# Patient Record
Sex: Male | Born: 1950 | Race: Black or African American | Hispanic: No | Marital: Married | Smoking: Never smoker
Health system: Southern US, Community
[De-identification: ages and names within clinical notes are randomized; demographics above are authoritative.]

## PROBLEM LIST (undated history)

## (undated) DIAGNOSIS — E785 Hyperlipidemia, unspecified: Secondary | ICD-10-CM

## (undated) DIAGNOSIS — I10 Essential (primary) hypertension: Secondary | ICD-10-CM

## (undated) HISTORY — DX: Essential (primary) hypertension: I10

## (undated) HISTORY — DX: Hyperlipidemia, unspecified: E78.5

---

## 2004-07-02 ENCOUNTER — Ambulatory Visit: Payer: Self-pay

## 2005-07-21 ENCOUNTER — Emergency Department: Payer: Self-pay | Admitting: Internal Medicine

## 2007-08-19 ENCOUNTER — Ambulatory Visit: Payer: Self-pay | Admitting: Gastroenterology

## 2009-01-27 HISTORY — PX: COLONOSCOPY: SHX174

## 2014-11-09 ENCOUNTER — Other Ambulatory Visit: Payer: Self-pay | Admitting: Family Medicine

## 2014-11-29 ENCOUNTER — Ambulatory Visit (INDEPENDENT_AMBULATORY_CARE_PROVIDER_SITE_OTHER): Payer: BC Managed Care – PPO | Admitting: Family Medicine

## 2014-11-29 ENCOUNTER — Encounter: Payer: Self-pay | Admitting: Family Medicine

## 2014-11-29 VITALS — BP 134/82 | HR 72 | Ht 68.0 in | Wt 165.0 lb

## 2014-11-29 DIAGNOSIS — Z1211 Encounter for screening for malignant neoplasm of colon: Secondary | ICD-10-CM

## 2014-11-29 DIAGNOSIS — E785 Hyperlipidemia, unspecified: Secondary | ICD-10-CM

## 2014-11-29 DIAGNOSIS — I10 Essential (primary) hypertension: Secondary | ICD-10-CM

## 2014-11-29 LAB — HEMOCCULT GUIAC POC 1CARD (OFFICE): Fecal Occult Blood, POC: NEGATIVE

## 2014-11-29 MED ORDER — AMLODIPINE BESYLATE 2.5 MG PO TABS
2.5000 mg | ORAL_TABLET | Freq: Every day | ORAL | Status: DC
Start: 2014-11-29 — End: 2015-05-29

## 2014-11-29 NOTE — Progress Notes (Signed)
Name: Christopher Browning   MRN: [id number]    DOB: January 15, 1951   Date:11/29/2014       Progress Note  Subjective  Chief Complaint  Chief Complaint  Patient presents with  . Hypertension    Hypertension This is a chronic problem. The current episode started more than 1 year ago. The problem has been gradually improving since onset. The problem is controlled. Pertinent negatives include no anxiety, blurred vision, chest pain, headaches, malaise/fatigue, neck pain, orthopnea, palpitations, peripheral edema, PND, shortness of breath or sweats. There are no associated agents to hypertension. Risk factors for coronary artery disease include male gender. Past treatments include calcium channel blockers. The current treatment provides mild improvement. There are no compliance problems.  There is no history of angina, kidney disease, CAD/MI, CVA, heart failure, left ventricular hypertrophy, PVD, renovascular disease or retinopathy. There is no history of chronic renal disease.    No problem-specific assessment & plan notes found for this encounter.   No past medical history on file.  Past Surgical History  Procedure Laterality Date  . Colonoscopy  2011    cleared for 5 yrs-    No family history on file.  Social History   Social History  . Marital Status: Married    Spouse Name: N/A  . Number of Children: N/A  . Years of Education: N/A   Occupational History  . Not on file.   Social History Main Topics  . Smoking status: Never Smoker   . Smokeless tobacco: Not on file  . Alcohol Use: No  . Drug Use: No  . Sexual Activity: Not on file   Other Topics Concern  . Not on file   Social History Narrative  . No narrative on file    No Known Allergies   Review of Systems  Constitutional: Negative for fever, chills, weight loss and malaise/fatigue.  HENT: Negative for ear discharge, ear pain and sore throat.   Eyes: Negative for blurred vision.  Respiratory: Negative for cough,  sputum production, shortness of breath and wheezing.   Cardiovascular: Negative for chest pain, palpitations, orthopnea, leg swelling and PND.  Gastrointestinal: Negative for heartburn, nausea, abdominal pain, diarrhea, constipation, blood in stool and melena.  Genitourinary: Negative for dysuria, urgency, frequency and hematuria.  Musculoskeletal: Negative for myalgias, back pain, joint pain and neck pain.  Skin: Negative for rash.  Neurological: Negative for dizziness, tingling, sensory change, focal weakness and headaches.  Endo/Heme/Allergies: Negative for environmental allergies and polydipsia. Does not bruise/bleed easily.  Psychiatric/Behavioral: Negative for depression and suicidal ideas. The patient is not nervous/anxious and does not have insomnia.      Objective  Filed Vitals:   11/29/14 0818  BP: 134/82  Pulse: 72  Height: 5\' 8"  (1.727 m)  Weight: 165 lb (74.844 kg)    Physical Exam  Constitutional: He is oriented to person, place, and time and well-developed, well-nourished, and in no distress.  HENT:  Head: Normocephalic.  Right Ear: External ear normal.  Left Ear: External ear normal.  Nose: Nose normal.  Mouth/Throat: Oropharynx is clear and moist.  Eyes: Conjunctivae and EOM are normal. Pupils are equal, round, and reactive to light. Right eye exhibits no discharge. Left eye exhibits no discharge. No scleral icterus.  Neck: Normal range of motion. Neck supple. No JVD present. No tracheal deviation present. No thyromegaly present.  Cardiovascular: Normal rate, regular rhythm, normal heart sounds and intact distal pulses.  Exam reveals no gallop and no friction rub.   No  murmur heard. Pulmonary/Chest: Breath sounds normal. No respiratory distress. He has no wheezes. He has no rales.  Abdominal: Soft. Bowel sounds are normal. He exhibits no mass. There is no hepatosplenomegaly. There is no tenderness. There is no rebound, no guarding and no CVA tenderness.   Genitourinary: Prostate normal. Guaiac positive stool.  Musculoskeletal: Normal range of motion. He exhibits no edema or tenderness.  Lymphadenopathy:    He has no cervical adenopathy.  Neurological: He is alert and oriented to person, place, and time. He has normal sensation, normal strength, normal reflexes and intact cranial nerves. No cranial nerve deficit.  Skin: Skin is warm. No rash noted.  Psychiatric: Mood and affect normal.  Nursing note and vitals reviewed.     Assessment & Plan  Problem List Items Addressed This Visit      Cardiovascular and Mediastinum   Essential hypertension - Primary   Relevant Medications   amLODipine (NORVASC) 2.5 MG tablet   Other Relevant Orders   Renal Function Panel    Other Visit Diagnoses    Colon cancer screening        Relevant Orders    POCT Occult Blood Stool (Completed)    Hyperlipidemia        Relevant Medications    amLODipine (NORVASC) 2.5 MG tablet    Other Relevant Orders    Lipid Profile         Dr. Elizabeth Sauer [facility]  11/29/2014

## 2014-11-30 LAB — RENAL FUNCTION PANEL
Albumin: 4.4 g/dL (ref 3.6–4.8)
BUN/Creatinine Ratio: 16 (ref 10–22)
BUN: 16 mg/dL (ref 8–27)
CHLORIDE: 101 mmol/L (ref 97–106)
CO2: 24 mmol/L (ref 18–29)
Calcium: 9.6 mg/dL (ref 8.6–10.2)
Creatinine, Ser: 0.99 mg/dL (ref 0.76–1.27)
GFR, EST AFRICAN AMERICAN: 94 mL/min/{1.73_m2} (ref >59)
GFR, EST NON AFRICAN AMERICAN: 81 mL/min/{1.73_m2} (ref >59)
GLUCOSE: 91 mg/dL (ref 65–99)
POTASSIUM: 4.1 mmol/L (ref 3.5–5.2)
Phosphorus: 3 mg/dL (ref 2.5–4.5)
SODIUM: 140 mmol/L (ref 136–144)

## 2014-11-30 LAB — LIPID PANEL
CHOL/HDL RATIO: 4 ratio (ref 0.0–5.0)
Cholesterol, Total: 199 mg/dL (ref 100–199)
HDL: 50 mg/dL (ref >39)
LDL Calculated: 134 mg/dL — ABNORMAL HIGH (ref 0–99)
Triglycerides: 73 mg/dL (ref 0–149)
VLDL Cholesterol Cal: 15 mg/dL (ref 5–40)

## 2014-12-07 ENCOUNTER — Other Ambulatory Visit (INDEPENDENT_AMBULATORY_CARE_PROVIDER_SITE_OTHER): Payer: BC Managed Care – PPO

## 2014-12-07 DIAGNOSIS — I10 Essential (primary) hypertension: Secondary | ICD-10-CM | POA: Diagnosis not present

## 2014-12-07 LAB — HEMOCCULT GUIAC POC 1CARD (OFFICE)
FECAL OCCULT BLD: NEGATIVE
FECAL OCCULT BLD: NEGATIVE
FECAL OCCULT BLD: NEGATIVE

## 2015-05-29 ENCOUNTER — Encounter: Payer: Self-pay | Admitting: Family Medicine

## 2015-05-29 ENCOUNTER — Ambulatory Visit (INDEPENDENT_AMBULATORY_CARE_PROVIDER_SITE_OTHER): Payer: BC Managed Care – PPO | Admitting: Family Medicine

## 2015-05-29 VITALS — BP 120/80 | HR 76 | Ht 68.0 in | Wt 165.0 lb

## 2015-05-29 DIAGNOSIS — I10 Essential (primary) hypertension: Secondary | ICD-10-CM | POA: Diagnosis not present

## 2015-05-29 MED ORDER — AMLODIPINE BESYLATE 2.5 MG PO TABS: 2.5000 mg | ORAL_TABLET | Freq: Every day | ORAL | Status: DC

## 2015-05-29 NOTE — Progress Notes (Signed)
Name: Christopher Browning   MRN: [id number]    DOB: 11-Sep-1950   Date:05/29/2015       Progress Note  Subjective  Chief Complaint  Chief Complaint  Patient presents with  . Hypertension    Hypertension This is a chronic problem. The current episode started more than 1 year ago. The problem has been gradually improving since onset. The problem is controlled. Pertinent negatives include no anxiety, blurred vision, chest pain, headaches, malaise/fatigue, neck pain, orthopnea, palpitations, peripheral edema, PND, shortness of breath or sweats. There are no associated agents to hypertension. There are no known risk factors for coronary artery disease. Past treatments include calcium channel blockers. The current treatment provides moderate improvement. There are no compliance problems.  There is no history of angina, kidney disease, CAD/MI, CVA, heart failure, left ventricular hypertrophy, PVD, renovascular disease or retinopathy. There is no history of chronic renal disease or a hypertension causing med.    No problem-specific assessment & plan notes found for this encounter.   Past Medical History  Diagnosis Date  . Hypertension     Past Surgical History  Procedure Laterality Date  . Colonoscopy  2011    cleared for 5 yrs-    History reviewed. No pertinent family history.  Social History   Social History  . Marital Status: Married    Spouse Name: N/A  . Number of Children: N/A  . Years of Education: N/A   Occupational History  . Not on file.   Social History Main Topics  . Smoking status: Never Smoker   . Smokeless tobacco: Not on file  . Alcohol Use: No  . Drug Use: No  . Sexual Activity: Not on file   Other Topics Concern  . Not on file   Social History Narrative    No Known Allergies   Review of Systems  Constitutional: Negative for fever, chills, weight loss and malaise/fatigue.  HENT: Negative for ear discharge, ear pain and sore throat.   Eyes: Negative for  blurred vision.  Respiratory: Negative for cough, sputum production, shortness of breath and wheezing.   Cardiovascular: Negative for chest pain, palpitations, orthopnea, leg swelling and PND.  Gastrointestinal: Negative for heartburn, nausea, abdominal pain, diarrhea, constipation, blood in stool and melena.  Genitourinary: Negative for dysuria, urgency, frequency and hematuria.  Musculoskeletal: Negative for myalgias, back pain, joint pain and neck pain.  Skin: Negative for rash.  Neurological: Negative for dizziness, tingling, sensory change, focal weakness and headaches.  Endo/Heme/Allergies: Negative for environmental allergies and polydipsia. Does not bruise/bleed easily.  Psychiatric/Behavioral: Negative for depression and suicidal ideas. The patient is not nervous/anxious and does not have insomnia.      Objective  Filed Vitals:   05/29/15 0819  BP: 120/80  Pulse: 76  Height: 5\' 8"  (1.727 m)  Weight: 165 lb (74.844 kg)    Physical Exam  Constitutional: He is oriented to person, place, and time and well-developed, well-nourished, and in no distress.  HENT:  Head: Normocephalic.  Right Ear: Tympanic membrane, external ear and ear canal normal.  Left Ear: Tympanic membrane, external ear and ear canal normal.  Nose: Nose normal.  Mouth/Throat: Oropharynx is clear and moist.  Eyes: Conjunctivae and EOM are normal. Pupils are equal, round, and reactive to light. Right eye exhibits no discharge. Left eye exhibits no discharge. No scleral icterus.  Neck: Normal range of motion. Neck supple. No JVD present. No tracheal deviation present. No thyromegaly present.  Cardiovascular: Normal rate, regular rhythm, normal heart  sounds and intact distal pulses.  Exam reveals no gallop and no friction rub.   No murmur heard. Pulmonary/Chest: Breath sounds normal. No respiratory distress. He has no wheezes. He has no rales.  Abdominal: Soft. Bowel sounds are normal. He exhibits no mass. There  is no hepatosplenomegaly. There is no tenderness. There is no rebound, no guarding and no CVA tenderness.  Musculoskeletal: Normal range of motion. He exhibits no edema or tenderness.  Lymphadenopathy:    He has no cervical adenopathy.  Neurological: He is alert and oriented to person, place, and time. He has normal sensation, normal strength and intact cranial nerves. No cranial nerve deficit.  Skin: Skin is warm. No rash noted.  Psychiatric: Mood and affect normal.  Nursing note and vitals reviewed.     Assessment & Plan  Problem List Items Addressed This Visit      Cardiovascular and Mediastinum   Essential hypertension - Primary   Relevant Medications   amLODipine (NORVASC) 2.5 MG tablet   Other Relevant Orders   Renal Function Panel        Dr. Elizabeth Sauer [facility]  05/29/2015

## 2015-05-30 LAB — RENAL FUNCTION PANEL
ALBUMIN: 4.6 g/dL (ref 3.6–4.8)
BUN/Creatinine Ratio: 18 (ref 10–24)
BUN: 17 mg/dL (ref 8–27)
CHLORIDE: 100 mmol/L (ref 96–106)
CO2: 22 mmol/L (ref 18–29)
CREATININE: 0.93 mg/dL (ref 0.76–1.27)
Calcium: 9.6 mg/dL (ref 8.6–10.2)
GFR calc non Af Amer: 87 mL/min/{1.73_m2} (ref >59)
GFR, EST AFRICAN AMERICAN: 101 mL/min/{1.73_m2} (ref >59)
Glucose: 76 mg/dL (ref 65–99)
Phosphorus: 2.7 mg/dL (ref 2.5–4.5)
Potassium: 4 mmol/L (ref 3.5–5.2)
Sodium: 140 mmol/L (ref 134–144)

## 2015-11-29 ENCOUNTER — Encounter: Payer: Self-pay | Admitting: Family Medicine

## 2015-11-29 ENCOUNTER — Ambulatory Visit (INDEPENDENT_AMBULATORY_CARE_PROVIDER_SITE_OTHER): Payer: BC Managed Care – PPO | Admitting: Family Medicine

## 2015-11-29 VITALS — BP 160/80 | HR 85 | Ht 68.0 in | Wt 164.0 lb

## 2015-11-29 DIAGNOSIS — I10 Essential (primary) hypertension: Secondary | ICD-10-CM

## 2015-11-29 DIAGNOSIS — R351 Nocturia: Secondary | ICD-10-CM

## 2015-11-29 DIAGNOSIS — Z1211 Encounter for screening for malignant neoplasm of colon: Secondary | ICD-10-CM | POA: Diagnosis not present

## 2015-11-29 DIAGNOSIS — Z23 Encounter for immunization: Secondary | ICD-10-CM | POA: Diagnosis not present

## 2015-11-29 LAB — HEMOCCULT GUIAC POC 1CARD (OFFICE): FECAL OCCULT BLD: NEGATIVE

## 2015-11-29 MED ORDER — AMLODIPINE BESYLATE 5 MG PO TABS
5.0000 mg | ORAL_TABLET | Freq: Every day | ORAL | 5 refills | Status: DC
Start: 2015-11-29 — End: 2016-02-01

## 2015-11-29 NOTE — Progress Notes (Signed)
Name: Christopher Browning   MRN: [id number]    DOB: December 30, 1950   Date:11/29/2015       Progress Note  Subjective  Chief Complaint  Chief Complaint  Patient presents with  . Hypertension    refill med    Hypertension  This is a chronic problem. The current episode started more than 1 year ago. The problem has been gradually worsening since onset. The problem is uncontrolled. Pertinent negatives include no anxiety, blurred vision, chest pain, headaches, malaise/fatigue, neck pain, orthopnea, palpitations, peripheral edema, PND, shortness of breath or sweats. Agents associated with hypertension include decongestants (taken last night). There are no known risk factors for coronary artery disease. There are no compliance problems.  There is no history of angina, kidney disease, CAD/MI, CVA, heart failure, left ventricular hypertrophy, PVD, renovascular disease or retinopathy. There is no history of chronic renal disease or a hypertension causing med.    No problem-specific Assessment & Plan notes found for this encounter.   Past Medical History:  Diagnosis Date  . Hypertension     Past Surgical History:  Procedure Laterality Date  . COLONOSCOPY  2011   cleared for 5 yrs-    History reviewed. No pertinent family history.  Social History   Social History  . Marital status: Married    Spouse name: N/A  . Number of children: N/A  . Years of education: N/A   Occupational History  . Not on file.   Social History Main Topics  . Smoking status: Never Smoker  . Smokeless tobacco: Not on file  . Alcohol use No  . Drug use: No  . Sexual activity: Not on file   Other Topics Concern  . Not on file   Social History Narrative  . No narrative on file    No Known Allergies   Review of Systems  Constitutional: Negative for chills, fever, malaise/fatigue and weight loss.  HENT: Negative for ear discharge, ear pain and sore throat.   Eyes: Negative for blurred vision.  Respiratory:  Negative for cough, sputum production, shortness of breath and wheezing.   Cardiovascular: Negative for chest pain, palpitations, orthopnea, leg swelling and PND.  Gastrointestinal: Negative for abdominal pain, blood in stool, constipation, diarrhea, heartburn, melena and nausea.  Genitourinary: Negative for dysuria, frequency, hematuria and urgency.  Musculoskeletal: Negative for back pain, joint pain, myalgias and neck pain.  Skin: Negative for rash.  Neurological: Negative for dizziness, tingling, sensory change, focal weakness and headaches.  Endo/Heme/Allergies: Negative for environmental allergies and polydipsia. Does not bruise/bleed easily.  Psychiatric/Behavioral: Negative for depression and suicidal ideas. The patient is not nervous/anxious and does not have insomnia.      Objective  Vitals:   11/29/15 0808  BP: (!) 160/80  Pulse: 85  Weight: 164 lb (74.4 kg)  Height: 5\' 8"  (1.727 m)    Physical Exam  Constitutional: Christopher Browning is oriented to person, place, and time and well-developed, well-nourished, and in no distress.  HENT:  Head: Normocephalic.  Right Ear: Tympanic membrane, external ear and ear canal normal.  Left Ear: Tympanic membrane, external ear and ear canal normal.  Nose: Nose normal.  Mouth/Throat: Uvula is midline, oropharynx is clear and moist and mucous membranes are normal. No oropharyngeal exudate, posterior oropharyngeal edema or posterior oropharyngeal erythema.  Eyes: Conjunctivae and EOM are normal. Pupils are equal, round, and reactive to light. Right eye exhibits no discharge. Left eye exhibits no discharge. No scleral icterus.  Neck: Normal range of motion. Neck supple.  No JVD present. No tracheal deviation present. No thyromegaly present.  Cardiovascular: Normal rate, regular rhythm, normal heart sounds and intact distal pulses.  Exam reveals no gallop and no friction rub.   No murmur heard. Pulmonary/Chest: Breath sounds normal. No respiratory distress.  Christopher Browning has no wheezes. Christopher Browning has no rales.  Abdominal: Soft. Bowel sounds are normal. Christopher Browning exhibits no mass. There is no hepatosplenomegaly. There is no tenderness. There is no rebound, no guarding and no CVA tenderness.  Genitourinary: Rectum normal and prostate normal. Rectal exam shows guaiac negative stool.  Musculoskeletal: Normal range of motion. Christopher Browning exhibits no edema or tenderness.  Lymphadenopathy:    Christopher Browning has no cervical adenopathy.  Neurological: Christopher Browning is alert and oriented to person, place, and time. Christopher Browning has normal sensation, normal strength, normal reflexes and intact cranial nerves. No cranial nerve deficit.  Skin: Skin is warm. No rash noted.  Psychiatric: Mood and affect normal.      Assessment & Plan  Problem List Items Addressed This Visit      Cardiovascular and Mediastinum   Essential hypertension - Primary   Relevant Medications   amLODipine (NORVASC) 5 MG tablet   Other Relevant Orders   Renal Function Panel    Other Visit Diagnoses    Nocturia       Relevant Orders   PSA   Immunization due       Relevant Orders   Tdap vaccine greater than or equal to 7yo IM (Completed)   Colon cancer screening       Relevant Orders   POCT Occult Blood Stool (Completed)   Need for Tdap vaccination        I spent 25 minutes with this patient, More than 50% of that time was spent in face to face education, counseling and care coordination.25    Dr. Hayden Rasmusseneanna Phineas Mcenroe [facility]  11/29/15

## 2015-11-30 LAB — RENAL FUNCTION PANEL
ALBUMIN: 4.5 g/dL (ref 3.6–4.8)
BUN/Creatinine Ratio: 14 (ref 10–24)
BUN: 16 mg/dL (ref 8–27)
CO2: 24 mmol/L (ref 18–29)
CREATININE: 1.12 mg/dL (ref 0.76–1.27)
Calcium: 9.8 mg/dL (ref 8.6–10.2)
Chloride: 100 mmol/L (ref 96–106)
GFR calc Af Amer: 80 mL/min/{1.73_m2} (ref >59)
GFR, EST NON AFRICAN AMERICAN: 70 mL/min/{1.73_m2} (ref >59)
Glucose: 89 mg/dL (ref 65–99)
PHOSPHORUS: 3 mg/dL (ref 2.5–4.5)
POTASSIUM: 4.2 mmol/L (ref 3.5–5.2)
Sodium: 142 mmol/L (ref 134–144)

## 2015-11-30 LAB — PSA: PROSTATE SPECIFIC AG, SERUM: 0.9 ng/mL (ref 0.0–4.0)

## 2016-02-01 ENCOUNTER — Ambulatory Visit (INDEPENDENT_AMBULATORY_CARE_PROVIDER_SITE_OTHER): Payer: BC Managed Care – PPO | Admitting: Family Medicine

## 2016-02-01 ENCOUNTER — Encounter: Payer: Self-pay | Admitting: Family Medicine

## 2016-02-01 VITALS — BP 142/78 | HR 80 | Ht 68.0 in | Wt 164.0 lb

## 2016-02-01 DIAGNOSIS — I10 Essential (primary) hypertension: Secondary | ICD-10-CM

## 2016-02-01 MED ORDER — AMLODIPINE BESYLATE 5 MG PO TABS: 5.0000 mg | ORAL_TABLET | Freq: Every day | ORAL | 1 refills | Status: DC

## 2016-02-01 NOTE — Progress Notes (Signed)
Name: Christopher Browning   MRN: [id number]    DOB: [birth year]   Date:02/01/2016       Progress Note  Subjective  Chief Complaint  Chief Complaint  Patient presents with  . Hypertension    changed B/P med last visit    Hypertension  This is a chronic problem. The current episode started more than 1 year ago. The problem has been gradually improving since onset. The problem is controlled. Pertinent negatives include no anxiety, blurred vision, chest pain, headaches, malaise/fatigue, neck pain, orthopnea, palpitations, peripheral edema, PND, shortness of breath or sweats. There are no associated agents to hypertension. There are no known risk factors for coronary artery disease. Past treatments include calcium channel blockers. The current treatment provides moderate improvement. There are no compliance problems.  There is no history of angina, kidney disease, CAD/MI, CVA, heart failure, left ventricular hypertrophy, PVD, renovascular disease or retinopathy. There is no history of chronic renal disease or a hypertension causing med.    No problem-specific Assessment & Plan notes found for this encounter.   Past Medical History:  Diagnosis Date  . Hypertension     Past Surgical History:  Procedure Laterality Date  . COLONOSCOPY  2011   cleared for 5 yrs-    History reviewed. No pertinent family history.  Social History   Social History  . Marital status: Married    Spouse name: N/A  . Number of children: N/A  . Years of education: N/A   Occupational History  . Not on file.   Social History Main Topics  . Smoking status: Never Smoker  . Smokeless tobacco: Not on file  . Alcohol use No  . Drug use: No  . Sexual activity: Not on file   Other Topics Concern  . Not on file   Social History Narrative  . No narrative on file    No Known Allergies   Review of Systems  Constitutional: Negative for chills, fever, malaise/fatigue and weight loss.  HENT: Negative for ear  discharge, ear pain and sore throat.   Eyes: Negative for blurred vision.  Respiratory: Negative for cough, sputum production, shortness of breath and wheezing.   Cardiovascular: Negative for chest pain, palpitations, orthopnea, leg swelling and PND.  Gastrointestinal: Negative for abdominal pain, blood in stool, constipation, diarrhea, heartburn, melena and nausea.  Genitourinary: Negative for dysuria, frequency, hematuria and urgency.  Musculoskeletal: Negative for back pain, joint pain, myalgias and neck pain.  Skin: Negative for rash.  Neurological: Negative for dizziness, tingling, sensory change, focal weakness and headaches.  Endo/Heme/Allergies: Negative for environmental allergies and polydipsia. Does not bruise/bleed easily.  Psychiatric/Behavioral: Negative for depression and suicidal ideas. The patient is not nervous/anxious and does not have insomnia.      Objective  Vitals:   02/01/16 0825  BP: (!) 142/78  Pulse: 80  Weight: 164 lb (74.4 kg)  Height: 5\' 8"  (1.727 m)    Physical Exam  Constitutional: He is oriented to person, place, and time and well-developed, well-nourished, and in no distress.  HENT:  Head: Normocephalic.  Right Ear: External ear normal.  Left Ear: External ear normal.  Nose: Nose normal.  Mouth/Throat: Oropharynx is clear and moist.  Eyes: Conjunctivae and EOM are normal. Pupils are equal, round, and reactive to light. Right eye exhibits no discharge. Left eye exhibits no discharge. No scleral icterus.  Neck: Normal range of motion. Neck supple. No JVD present. No tracheal deviation present. No thyromegaly present.  Cardiovascular: Normal rate, regular  rhythm, normal heart sounds and intact distal pulses.  Exam reveals no gallop and no friction rub.   No murmur heard. Pulmonary/Chest: Breath sounds normal. No respiratory distress. He has no wheezes. He has no rales.  Abdominal: Soft. Bowel sounds are normal. He exhibits no mass. There is no  hepatosplenomegaly. There is no tenderness. There is no rebound, no guarding and no CVA tenderness.  Musculoskeletal: Normal range of motion. He exhibits no edema or tenderness.  Lymphadenopathy:    He has no cervical adenopathy.  Neurological: He is alert and oriented to person, place, and time. He has normal sensation, normal strength, normal reflexes and intact cranial nerves. No cranial nerve deficit.  Skin: Skin is warm. No rash noted.  Psychiatric: Mood and affect normal.  Nursing note and vitals reviewed.     Assessment & Plan  Problem List Items Addressed This Visit      Cardiovascular and Mediastinum   Essential hypertension - Primary   Relevant Medications   amLODipine (NORVASC) 5 MG tablet    I spent 15 minutes with this patient, More than 50% of that time was spent in face to face education, counseling and care coordination.   Dr. Hayden Rasmusseneanna Vennessa Affinito [facility]  02/01/16

## 2016-06-30 ENCOUNTER — Ambulatory Visit (INDEPENDENT_AMBULATORY_CARE_PROVIDER_SITE_OTHER): Payer: BC Managed Care – PPO | Admitting: Family Medicine

## 2016-06-30 ENCOUNTER — Encounter: Payer: Self-pay | Admitting: Family Medicine

## 2016-06-30 VITALS — BP 122/80 | HR 80 | Ht 68.0 in | Wt 164.0 lb

## 2016-06-30 DIAGNOSIS — J40 Bronchitis, not specified as acute or chronic: Secondary | ICD-10-CM

## 2016-06-30 DIAGNOSIS — J0100 Acute maxillary sinusitis, unspecified: Secondary | ICD-10-CM | POA: Diagnosis not present

## 2016-06-30 MED ORDER — AMOXICILLIN 500 MG PO CAPS: 500.0000 mg | ORAL_CAPSULE | Freq: Three times a day (TID) | ORAL | 0 refills | Status: DC

## 2016-06-30 MED ORDER — GUAIFENESIN-CODEINE 100-10 MG/5ML PO SYRP: 5.0000 mL | ORAL_SOLUTION | Freq: Three times a day (TID) | ORAL | 0 refills | Status: DC | PRN

## 2016-06-30 NOTE — Progress Notes (Signed)
Name: Christopher Browning   MRN: [id number]    DOB: 11-10-1950   Date:06/30/2016       Progress Note  Subjective  Chief Complaint  Chief Complaint  Patient presents with  . Sinusitis    cough and cong- otc not helping    Sinusitis  This is a new problem. The current episode started in the past 7 days. The problem has been gradually worsening since onset. There has been no fever. He is experiencing no pain. Associated symptoms include congestion, coughing, diaphoresis, headaches and sinus pressure. Pertinent negatives include no chills, ear pain, hoarse voice, neck pain, shortness of breath, sneezing, sore throat or swollen glands. Past treatments include oral decongestants. The treatment provided no relief.  Cough  This is a new problem. The current episode started in the past 7 days. The problem has been waxing and waning. The problem occurs hourly. The cough is productive of purulent sputum and productive of sputum. Associated symptoms include headaches, nasal congestion, postnasal drip and rhinorrhea. Pertinent negatives include no chest pain, chills, ear pain, fever, heartburn, hemoptysis, myalgias, rash, sore throat, shortness of breath, weight loss or wheezing. There is no history of environmental allergies.    No problem-specific Assessment & Plan notes found for this encounter.   Past Medical History:  Diagnosis Date  . Hypertension     Past Surgical History:  Procedure Laterality Date  . COLONOSCOPY  2011   cleared for 5 yrs-    No family history on file.  Social History   Social History  . Marital status: Married    Spouse name: N/A  . Number of children: N/A  . Years of education: N/A   Occupational History  . Not on file.   Social History Main Topics  . Smoking status: Never Smoker  . Smokeless tobacco: Never Used  . Alcohol use No  . Drug use: No  . Sexual activity: Not on file   Other Topics Concern  . Not on file   Social History Narrative  . No  narrative on file    No Known Allergies  Outpatient Medications Prior to Visit  Medication Sig Dispense Refill  . amLODipine (NORVASC) 5 MG tablet Take 1 tablet (5 mg total) by mouth daily. 90 tablet 1   No facility-administered medications prior to visit.     Review of Systems  Constitutional: Positive for diaphoresis. Negative for chills, fever, malaise/fatigue and weight loss.  HENT: Positive for congestion, postnasal drip, rhinorrhea and sinus pressure. Negative for ear discharge, ear pain, hoarse voice, sneezing and sore throat.   Eyes: Negative for blurred vision.  Respiratory: Positive for cough. Negative for hemoptysis, sputum production, shortness of breath and wheezing.   Cardiovascular: Negative for chest pain, palpitations and leg swelling.  Gastrointestinal: Negative for abdominal pain, blood in stool, constipation, diarrhea, heartburn, melena and nausea.  Genitourinary: Negative for dysuria, frequency, hematuria and urgency.  Musculoskeletal: Negative for back pain, joint pain, myalgias and neck pain.  Skin: Negative for rash.  Neurological: Positive for headaches. Negative for dizziness, tingling, sensory change and focal weakness.  Endo/Heme/Allergies: Negative for environmental allergies and polydipsia. Does not bruise/bleed easily.  Psychiatric/Behavioral: Negative for depression and suicidal ideas. The patient is not nervous/anxious and does not have insomnia.      Objective  Vitals:   06/30/16 0956  BP: 122/80  Pulse: 80  Weight: 164 lb (74.4 kg)  Height: 5\' 8"  (1.727 m)    Physical Exam  Constitutional: He is oriented to  person, place, and time and well-developed, well-nourished, and in no distress.  HENT:  Head: Normocephalic.  Right Ear: External ear normal.  Left Ear: External ear normal.  Nose: Nose normal.  Mouth/Throat: Oropharynx is clear and moist.  Eyes: Conjunctivae and EOM are normal. Pupils are equal, round, and reactive to light. Right  eye exhibits no discharge. Left eye exhibits no discharge. No scleral icterus.  Neck: Normal range of motion. Neck supple. No JVD present. No tracheal deviation present. No thyromegaly present.  Cardiovascular: Normal rate, regular rhythm, normal heart sounds and intact distal pulses.  Exam reveals no gallop and no friction rub.   No murmur heard. Pulmonary/Chest: Breath sounds normal. No respiratory distress. He has no wheezes. He has no rales.  Abdominal: Soft. Bowel sounds are normal. He exhibits no mass. There is no hepatosplenomegaly. There is no tenderness. There is no rebound, no guarding and no CVA tenderness.  Musculoskeletal: Normal range of motion. He exhibits no edema or tenderness.  Lymphadenopathy:    He has no cervical adenopathy.  Neurological: He is alert and oriented to person, place, and time. He has normal sensation, normal strength, normal reflexes and intact cranial nerves. No cranial nerve deficit.  Skin: Skin is warm. No rash noted.  Psychiatric: Mood and affect normal.  Nursing note and vitals reviewed.     Assessment & Plan  Problem List Items Addressed This Visit    None    Visit Diagnoses    Acute maxillary sinusitis, recurrence not specified    -  Primary   Relevant Medications   amoxicillin (AMOXIL) 500 MG capsule   guaiFENesin-codeine (ROBITUSSIN AC) 100-10 MG/5ML syrup   Bronchitis       Relevant Medications   amoxicillin (AMOXIL) 500 MG capsule   guaiFENesin-codeine (ROBITUSSIN AC) 100-10 MG/5ML syrup      Meds ordered this encounter  Medications  . amoxicillin (AMOXIL) 500 MG capsule    Sig: Take 1 capsule (500 mg total) by mouth 3 (three) times daily.    Dispense:  30 capsule    Refill:  0  . guaiFENesin-codeine (ROBITUSSIN AC) 100-10 MG/5ML syrup    Sig: Take 5 mLs by mouth 3 (three) times daily as needed for cough.    Dispense:  100 mL    Refill:  0      Dr. Hayden Rasmusseneanna [facility]  06/30/16

## 2016-08-01 ENCOUNTER — Ambulatory Visit: Payer: BC Managed Care – PPO | Admitting: Family Medicine

## 2016-08-04 ENCOUNTER — Encounter: Payer: Self-pay | Admitting: Family Medicine

## 2016-08-04 ENCOUNTER — Ambulatory Visit (INDEPENDENT_AMBULATORY_CARE_PROVIDER_SITE_OTHER): Payer: BC Managed Care – PPO | Admitting: Family Medicine

## 2016-08-04 DIAGNOSIS — I10 Essential (primary) hypertension: Secondary | ICD-10-CM | POA: Diagnosis not present

## 2016-08-04 MED ORDER — AMLODIPINE BESYLATE 5 MG PO TABS: 5.0000 mg | ORAL_TABLET | Freq: Every day | ORAL | 1 refills | Status: DC

## 2016-08-04 NOTE — Progress Notes (Signed)
Name: Christopher Browning   MRN: [id number]    DOB: Sep 28, 1950   Date:08/04/2016       Progress Note  Subjective  Chief Complaint  Chief Complaint  Patient presents with  . Hypertension    Hypertension  This is a chronic problem. The current episode started more than 1 year ago. The problem has been gradually improving since onset. The problem is controlled. Pertinent negatives include no anxiety, blurred vision, chest pain, headaches, malaise/fatigue, neck pain, orthopnea, palpitations, peripheral edema, PND, shortness of breath or sweats. There are no associated agents to hypertension. Past treatments include calcium channel blockers.    No problem-specific Assessment & Plan notes found for this encounter.   Past Medical History:  Diagnosis Date  . Hypertension     Past Surgical History:  Procedure Laterality Date  . COLONOSCOPY  2011   cleared for 5 yrs-    No family history on file.  Social History   Social History  . Marital status: Married    Spouse name: N/A  . Number of children: N/A  . Years of education: N/A   Occupational History  . Not on file.   Social History Main Topics  . Smoking status: Never Smoker  . Smokeless tobacco: Never Used  . Alcohol use No  . Drug use: No  . Sexual activity: Not on file   Other Topics Concern  . Not on file   Social History Narrative  . No narrative on file    No Known Allergies  Outpatient Medications Prior to Visit  Medication Sig Dispense Refill  . amLODipine (NORVASC) 5 MG tablet Take 1 tablet (5 mg total) by mouth daily. 90 tablet 1  . amoxicillin (AMOXIL) 500 MG capsule Take 1 capsule (500 mg total) by mouth 3 (three) times daily. 30 capsule 0  . guaiFENesin-codeine (ROBITUSSIN AC) 100-10 MG/5ML syrup Take 5 mLs by mouth 3 (three) times daily as needed for cough. 100 mL 0   No facility-administered medications prior to visit.     Review of Systems  Constitutional: Negative for chills, fever, malaise/fatigue  and weight loss.  HENT: Negative for ear discharge, ear pain and sore throat.   Eyes: Negative for blurred vision.  Respiratory: Negative for cough, sputum production, shortness of breath and wheezing.   Cardiovascular: Negative for chest pain, palpitations, orthopnea, leg swelling and PND.  Gastrointestinal: Negative for abdominal pain, blood in stool, constipation, diarrhea, heartburn, melena and nausea.  Genitourinary: Negative for dysuria, frequency, hematuria and urgency.  Musculoskeletal: Negative for back pain, joint pain, myalgias and neck pain.  Skin: Negative for rash.  Neurological: Negative for dizziness, tingling, sensory change, focal weakness and headaches.  Endo/Heme/Allergies: Negative for environmental allergies and polydipsia. Does not bruise/bleed easily.  Psychiatric/Behavioral: Negative for depression and suicidal ideas. The patient is not nervous/anxious and does not have insomnia.      Objective  Vitals:   08/04/16 0819  BP: 120/86  Pulse: 68  Weight: 164 lb (74.4 kg)  Height: 5\' 8"  (1.727 m)    Physical Exam  Constitutional: He is oriented to person, place, and time and well-developed, well-nourished, and in no distress.  HENT:  Head: Normocephalic.  Right Ear: Tympanic membrane, external ear and ear canal normal.  Left Ear: Tympanic membrane, external ear and ear canal normal.  Nose: Nose normal.  Mouth/Throat: Oropharynx is clear and moist. No oropharyngeal exudate, posterior oropharyngeal edema or posterior oropharyngeal erythema.  Eyes: Conjunctivae and EOM are normal. Pupils are equal, round,  and reactive to light. Right eye exhibits no discharge. Left eye exhibits no discharge. No scleral icterus.  Neck: Normal range of motion. Neck supple. No JVD present. No tracheal deviation present. No thyromegaly present.  Cardiovascular: Normal rate, regular rhythm, S1 normal, S2 normal, normal heart sounds and intact distal pulses.  Exam reveals no gallop, no  S3, no S4 and no friction rub.   No murmur heard. Pulmonary/Chest: Breath sounds normal. No respiratory distress. He has no wheezes. He has no rales.  Abdominal: Soft. Bowel sounds are normal. He exhibits no mass. There is no hepatosplenomegaly. There is no tenderness. There is no rebound, no guarding and no CVA tenderness.  Musculoskeletal: Normal range of motion. He exhibits no edema or tenderness.  Lymphadenopathy:       Head (right side): No submandibular adenopathy present.       Head (left side): No submandibular adenopathy present.    He has no cervical adenopathy.  Neurological: He is alert and oriented to person, place, and time. He has normal sensation, normal strength, normal reflexes and intact cranial nerves. No cranial nerve deficit.  Skin: Skin is warm and intact. No rash noted.  Psychiatric: Mood and affect normal.  Nursing note and vitals reviewed.     Assessment & Plan  Problem List Items Addressed This Visit      Cardiovascular and Mediastinum   Essential hypertension   Relevant Medications   amLODipine (NORVASC) 5 MG tablet      Meds ordered this encounter  Medications  . amLODipine (NORVASC) 5 MG tablet    Sig: Take 1 tablet (5 mg total) by mouth daily.    Dispense:  90 tablet    Refill:  1      Dr. Elizabeth Sauereanna [facility]yllan [facility]  08/04/16

## 2016-09-11 ENCOUNTER — Ambulatory Visit (INDEPENDENT_AMBULATORY_CARE_PROVIDER_SITE_OTHER): Payer: BC Managed Care – PPO | Admitting: Family Medicine

## 2016-09-11 ENCOUNTER — Encounter: Payer: Self-pay | Admitting: Family Medicine

## 2016-09-11 VITALS — BP 140/88 | HR 80 | Ht 68.0 in | Wt 163.0 lb

## 2016-09-11 DIAGNOSIS — I10 Essential (primary) hypertension: Secondary | ICD-10-CM

## 2016-09-11 DIAGNOSIS — G451 Carotid artery syndrome (hemispheric): Secondary | ICD-10-CM

## 2016-09-11 DIAGNOSIS — E7800 Pure hypercholesterolemia, unspecified: Secondary | ICD-10-CM | POA: Diagnosis not present

## 2016-09-11 NOTE — Progress Notes (Signed)
Name: Christopher Browning   MRN: [id number]    DOB: Mar 21, 1950   Date:09/11/2016       Progress Note  Subjective  Chief Complaint  Chief Complaint  Patient presents with  . Numbness    L) sided numbness- starts under nose and runs down L) side of body. Had an episode about 1 year ago, but was sitting in a chair and thought that was the reason for it. Happened again about 3 weeks ago while working, ate a little debbie honey bun and drank a bottle of water and "it went on"    Neurologic Problem  The patient's primary symptoms include focal sensory loss and a loss of balance. The patient's pertinent negatives include no altered mental status, clumsiness, focal weakness, memory loss, near-syncope, slurred speech, syncope, visual change or weakness. This is a new problem. The current episode started 1 to 4 weeks ago (first Thanksgiving 2017/second 3 weeks ago). The neurological problem developed suddenly. The problem has been waxing and waning since onset. There was left-sided, facial, lower extremity and upper extremity focality noted. Pertinent negatives include no abdominal pain, auditory change, aura, back pain, bladder incontinence, bowel incontinence, chest pain, confusion, diaphoresis, dizziness, fatigue, fever, headaches, light-headedness, nausea, neck pain, palpitations, shortness of breath, vertigo or vomiting. Past treatments include nothing. There is no history of a bleeding disorder, a clotting disorder, a CVA, head trauma, liver disease, mood changes or seizures.  Hypertension  This is a chronic problem. The current episode started more than 1 year ago. The problem is unchanged. The problem is controlled. Pertinent negatives include no anxiety, blurred vision, chest pain, headaches, malaise/fatigue, neck pain, orthopnea, palpitations, peripheral edema, PND, shortness of breath or sweats.    No problem-specific Assessment & Plan notes found for this encounter.   Past Medical History:   Diagnosis Date  . Hypertension     Past Surgical History:  Procedure Laterality Date  . COLONOSCOPY  2011   cleared for 5 yrs-    No family history on file.  Social History   Social History  . Marital status: Married    Spouse name: N/A  . Number of children: N/A  . Years of education: N/A   Occupational History  . Not on file.   Social History Main Topics  . Smoking status: Never Smoker  . Smokeless tobacco: Never Used  . Alcohol use No  . Drug use: No  . Sexual activity: Not on file   Other Topics Concern  . Not on file   Social History Narrative  . No narrative on file    No Known Allergies  Outpatient Medications Prior to Visit  Medication Sig Dispense Refill  . amLODipine (NORVASC) 5 MG tablet Take 1 tablet (5 mg total) by mouth daily. 90 tablet 1   No facility-administered medications prior to visit.     Review of Systems  Constitutional: Negative for diaphoresis, fatigue, fever and malaise/fatigue.  Eyes: Negative for blurred vision.  Respiratory: Negative for shortness of breath.   Cardiovascular: Negative for chest pain, palpitations, orthopnea, PND and near-syncope.  Gastrointestinal: Negative for abdominal pain, bowel incontinence, nausea and vomiting.  Genitourinary: Negative for bladder incontinence.  Musculoskeletal: Negative for back pain and neck pain.  Neurological: Positive for loss of balance. Negative for dizziness, vertigo, focal weakness, syncope, weakness, light-headedness and headaches.  Psychiatric/Behavioral: Negative for confusion and memory loss.     Objective  Vitals:   09/11/16 1503  BP: 140/88  Pulse: 80  Weight: 163  lb (73.9 kg)  Height: 5\' 8"  (1.727 m)    Physical Exam  Constitutional: He is oriented to person, place, and time and well-developed, well-nourished, and in no distress.  HENT:  Head: Normocephalic.  Right Ear: External ear normal.  Left Ear: External ear normal.  Nose: Nose normal.   Mouth/Throat: Oropharynx is clear and moist.  Eyes: Pupils are equal, round, and reactive to light. Conjunctivae and EOM are normal. Right eye exhibits no discharge. Left eye exhibits no discharge. No scleral icterus.  Neck: Normal range of motion and full passive range of motion without pain. Neck supple. Normal carotid pulses, no hepatojugular reflux and no JVD present. Carotid bruit is not present. No neck rigidity. No tracheal deviation, no edema, no erythema and normal range of motion present. No thyroid mass and no thyromegaly present.  Cardiovascular: Normal rate, regular rhythm, normal heart sounds and intact distal pulses.  Exam reveals no gallop and no friction rub.   No murmur heard. Pulmonary/Chest: Breath sounds normal. No respiratory distress. He has no wheezes. He has no rales. He exhibits no tenderness.  Abdominal: Soft. Bowel sounds are normal. He exhibits no mass. There is no hepatosplenomegaly. There is no tenderness. There is no rebound, no guarding and no CVA tenderness.  Musculoskeletal: Normal range of motion. He exhibits no edema or tenderness.  Lymphadenopathy:    He has no cervical adenopathy.  Neurological: He is alert and oriented to person, place, and time. He has normal motor skills, normal sensation, normal strength, normal reflexes and intact cranial nerves. He displays normal speech and normal reflexes. No cranial nerve deficit or sensory deficit.  Skin: Skin is warm. No rash noted.  Psychiatric: Mood and affect normal.  Nursing note and vitals reviewed.     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     Dr. Hayden Rasmusseneanna [facility]  09/11/16

## 2016-09-12 MED ORDER — HYDROCHLOROTHIAZIDE 12.5 MG PO CAPS: 12.5000 mg | ORAL_CAPSULE | Freq: Every day | ORAL | 1 refills | Status: DC

## 2016-09-12 NOTE — Addendum Note (Signed)
Addended by: Everitt Amber on: 09/12/2016 09:27 AM   Modules accepted: Orders

## 2016-09-13 LAB — LIPID PANEL
CHOL/HDL RATIO: 3.7 ratio (ref 0.0–5.0)
CHOLESTEROL TOTAL: 206 mg/dL — AB (ref 100–199)
HDL: 56 mg/dL (ref >39)
LDL CALC: 131 mg/dL — AB (ref 0–99)
Triglycerides: 96 mg/dL (ref 0–149)
VLDL CHOLESTEROL CAL: 19 mg/dL (ref 5–40)

## 2016-09-13 LAB — RENAL FUNCTION PANEL
Albumin: 4.5 g/dL (ref 3.6–4.8)
BUN / CREAT RATIO: 15 (ref 10–24)
BUN: 16 mg/dL (ref 8–27)
CHLORIDE: 102 mmol/L (ref 96–106)
CO2: 20 mmol/L (ref 20–29)
Calcium: 9.6 mg/dL (ref 8.6–10.2)
Creatinine, Ser: 1.06 mg/dL (ref 0.76–1.27)
GFR calc non Af Amer: 74 mL/min/{1.73_m2} (ref >59)
GFR, EST AFRICAN AMERICAN: 85 mL/min/{1.73_m2} (ref >59)
GLUCOSE: 76 mg/dL (ref 65–99)
Phosphorus: 2.9 mg/dL (ref 2.5–4.5)
Potassium: 4.1 mmol/L (ref 3.5–5.2)
SODIUM: 142 mmol/L (ref 134–144)

## 2016-09-16 ENCOUNTER — Ambulatory Visit
Admission: RE | Admit: 2016-09-16 | Discharge: 2016-09-16 | Disposition: A | Discharge status: Home / Self Care | Payer: BC Managed Care – PPO | Source: Ambulatory Visit | Attending: Family Medicine | Admitting: Family Medicine

## 2016-09-16 DIAGNOSIS — I6523 Occlusion and stenosis of bilateral carotid arteries: Secondary | ICD-10-CM | POA: Diagnosis not present

## 2016-09-16 DIAGNOSIS — G451 Carotid artery syndrome (hemispheric): Secondary | ICD-10-CM | POA: Insufficient documentation

## 2016-09-17 ENCOUNTER — Other Ambulatory Visit: Payer: Self-pay

## 2016-09-17 DIAGNOSIS — G451 Carotid artery syndrome (hemispheric): Secondary | ICD-10-CM

## 2016-09-18 ENCOUNTER — Encounter: Payer: Self-pay | Admitting: Family Medicine

## 2016-09-18 ENCOUNTER — Ambulatory Visit (INDEPENDENT_AMBULATORY_CARE_PROVIDER_SITE_OTHER): Payer: BC Managed Care – PPO | Admitting: Family Medicine

## 2016-09-18 VITALS — BP 148/90 | HR 72 | Ht 68.0 in | Wt 169.0 lb

## 2016-09-18 DIAGNOSIS — I10 Essential (primary) hypertension: Secondary | ICD-10-CM | POA: Diagnosis not present

## 2016-09-18 DIAGNOSIS — E7800 Pure hypercholesterolemia, unspecified: Secondary | ICD-10-CM

## 2016-09-18 MED ORDER — ATORVASTATIN CALCIUM 10 MG PO TABS: 10.0000 mg | ORAL_TABLET | Freq: Every day | ORAL | 3 refills | Status: DC

## 2016-09-18 MED ORDER — HYDROCHLOROTHIAZIDE 25 MG PO TABS: 25.0000 mg | ORAL_TABLET | Freq: Every day | ORAL | 3 refills | Status: DC

## 2016-09-18 NOTE — Patient Instructions (Addendum)
High Blood Pressure: Low-Sodium Diet   Many people find that cutting down on sodium lowers their blood pressure. A low-sodium diet limits the amount of sodium in your diet to no more than 2300 milligrams a day. One teaspoon of salt has about 2300 milligrams of sodium.   Our taste for salt is mainly a habit. When you gradually lower the amount of salt in your diet, your taste begins to change. After a while, food begins to taste better without salt than it did with it.   Dietary Recommendations   Table salt added to foods is a common source of sodium in the diet. By not adding salt to foods, you can reduce the amount of sodium in your diet. But sodium is also found in canned and prepared foods, even if they don't taste salty. Learn which foods to avoid by reading labels to find out how much sodium is in the foods. You can reduce the amount of sodium in your diet by following these guidelines:   Read labels carefully. Look for any form of sodium or salt, such as sodium benzoate or sodium citrate. Choose foods that have less salt.  Add very little or no salt to food that you prepare.  Check the sodium content when you use baking powder, baking soda, and monosodium glutamate (MSG).  Do not add salt to food at the table.  Fast foods are very high in salt, as are many other restaurant foods. When you eat at a restaurant, try steamed fish and vegetables or fresh salads. Avoid soups.  Avoid eating the following foods:  ketchup, prepared mustard, pickles, and olives  soy sauce, steak or barbecue sauce, chili sauce, or Worcestershire sauce  bouillon cubes  commercially prepared or cured meats or fish (for example, bacon, luncheon meats, and canned sardines)  canned vegetables, soups, and other packaged convenience foods  salty cheeses and buttermilk  salted nuts and peanut butter  self-rising flour and biscuit mixes  salted crackers, chips, popcorn, and pretzels  commercial salad dressings  instant  cooked cereals.    Many of these foods are now available in unsalted or low-sodium versions. Read all labels carefully.  If your diet must be restricted to much lower amounts of sodium, talk to your health care provider and a registered dietitian for help in planning your meals. It is important to keep your meals nutritionally balanced and tasty. It can be hard to follow a restricted-salt diet if the food doesn't taste good, but there are many healthy ways to add taste without adding salt or fat.   Use of Salt Substitutes   Ask your health care provider about using salt substitutes. Most salt substitutes contain potassium for flavor. If you are taking certain medications, you may need to be careful about the amount of potassium in your diet.        Substitutions and Hints   Season foods with herbs and spices. Use onions, garlic, parsley, lemon and lime juice and rind, dill weed, basil, tarragon, marjoram, thyme, curry powder, turmeric, cumin, paprika, vinegar, or wine to enhance the flavor and aroma of foods. Mushrooms, celery, red pepper, yellow pepper, green pepper, and dried fruits also enhance specific dishes.  Eat fresh foods (instead of canned or packaged foods) as much as possible. Also, plain frozen fruits and vegetables usually do not have added salt.  Add a pinch of sugar or a squeeze of lemon juice to bring out the flavor in fresh vegetables.  If you must   use canned products, use the low-sodium types (except for fruit). Rinse canned vegetables with tap water before cooking.  Substitute unsalted, polyunsaturated margarine for regular margarine or butter.  Eat low-sodium cheeses. Many are available now, some with herbs and spices that are very tasty, and many are also low-fat.  Drink low-sodium juices.  Make unsalted or lightly salted soup stocks and keep them in the freezer to use as substitutes for canned broth and bouillon. Use these stocks to enhance vegetables.  Substitute  wines and vinegars (especially the flavored vinegars) for salt to enhance flavors.  Eat tuna and salmon and rinse first with running water.  Use herbs such as bay leaf, curry, turmeric, cumin, cilantro, dill, marjoram, paprika, pepper, tarragon, thyme, sage, onions, or garlic to season chicken, beef, or fish.  Cook rice in homemade broth with mushrooms and scallions or shallots.  Help Yourself Become Healthier   Check food labels for sodium and fat content.  Read nutrition information available at your [employer], from the [employer], and through nutrition programs and health fairs. Ask your health care provider for printed information on nutrition, diet, and health.  Contact a dietitian for information.  Look for some of the excellent low-sodium cookbooks available in most bookstores.  Take time to plan and enjoy your meals. You will be pleasantly surprised at how fast you learn new food preparations, how lowering your sodium intake lowers your blood pressure, and how good food can be.  Stroke Prevention Some health problems and behaviors may make it more likely for you to have a stroke. Below are ways to lessen your risk of having a stroke.  Be active for at least 30 minutes on most or all days.  Do not smoke. Try not to be around others who smoke.  Do not drink too much alcohol. ? Do not have more than 2 drinks a day if you are a man. ? Do not have more than 1 drink a day if you are a woman and are not pregnant.  Eat healthy foods, such as fruits and vegetables. If you were put on a specific diet, follow the diet as told.  Keep your cholesterol levels under control through diet and medicines. Look for foods that are low in saturated fat, trans fat, cholesterol, and are high in fiber.  If you have diabetes, follow all diet plans and take your medicine as told.  Ask your doctor if you need treatment to lower your blood pressure. If you have high blood pressure  (hypertension), follow all diet plans and take your medicine as told by your doctor.  If you are 64-80 years old, have your blood pressure checked every 3-5 years. If you are age 75 or older, have your blood pressure checked every year.  Keep a healthy weight. Eat foods that are low in calories, salt, saturated fat, trans fat, and cholesterol.  Do not take drugs.  Avoid birth control pills, if this applies. Talk to your doctor about the risks of taking birth control pills.  Talk to your doctor if you have sleep problems (sleep apnea).  Take all medicine as told by your doctor. ? You may be told to take aspirin or blood thinner medicine. Take this medicine as told by your doctor. ? Understand your medicine instructions.  Make sure any other conditions you have are being taken care of.  Get help right away if:  You suddenly lose feeling (you feel numb) or have weakness in your face,  arm, or leg.  Your face or eyelid hangs down to one side.  You suddenly feel confused.  You have trouble talking (aphasia) or understanding what people are saying.  You suddenly have trouble seeing in one or both eyes.  You suddenly have trouble walking.  You are dizzy.  You lose your balance or your movements are clumsy (uncoordinated).  You suddenly have a very bad headache and you do not know the cause.  You have new chest pain.  Your heart feels like it is fluttering or skipping a beat (irregular heartbeat). Do not wait to see if the symptoms above go away. Get help right away. Call your local emergency services (911 in [place] Do not drive yourself to the hospital. This information is not intended to replace advice given to you by your health care provider. Make sure you discuss any questions you have with your health care provider. Document Released: 07/15/2011 Document Revised: 06/21/2015 Document Reviewed: 07/16/2012 Elsevier Interactive Patient Education  [employer].

## 2016-09-18 NOTE — Progress Notes (Signed)
Name: Christopher Browning   MRN: [id number]    DOB: [birth year]   Date:09/18/2016       Progress Note  Subjective  Chief Complaint  Chief Complaint  Patient presents with  . Hypertension    took B/P at school yesterday- 160/112    Hypertension  This is a chronic problem. The current episode started more than 1 year ago. The problem has been waxing and waning since onset. The problem is uncontrolled. Pertinent negatives include no anxiety, blurred vision, chest pain, headaches, malaise/fatigue, neck pain, orthopnea, palpitations, peripheral edema, PND, shortness of breath or sweats. There are no associated agents to hypertension. There are no known risk factors for coronary artery disease. Past treatments include calcium channel blockers and diuretics. The current treatment provides moderate improvement. There are no compliance problems.  There is no history of angina, kidney disease, CAD/MI, CVA, heart failure, left ventricular hypertrophy, PVD or retinopathy. tia. There is no history of chronic renal disease, a hypertension causing med or renovascular disease.    No problem-specific Assessment & Plan notes found for this encounter.   Past Medical History:  Diagnosis Date  . Hypertension     Past Surgical History:  Procedure Laterality Date  . COLONOSCOPY  2011   cleared for 5 yrs-    No family history on file.  Social History   Social History  . Marital status: Married    Spouse name: N/A  . Number of children: N/A  . Years of education: N/A   Occupational History  . Not on file.   Social History Main Topics  . Smoking status: Never Smoker  . Smokeless tobacco: Never Used  . Alcohol use No  . Drug use: No  . Sexual activity: Not on file   Other Topics Concern  . Not on file   Social History Narrative  . No narrative on file    No Known Allergies  Outpatient Medications Prior to Visit  Medication Sig Dispense Refill  . amLODipine (NORVASC) 5 MG tablet Take 1  tablet (5 mg total) by mouth daily. 90 tablet 1  . hydrochlorothiazide (MICROZIDE) 12.5 MG capsule Take 1 capsule (12.5 mg total) by mouth daily. Can change to tablets if cheaper 30 capsule 1   No facility-administered medications prior to visit.     Review of Systems  Constitutional: Negative for chills, diaphoresis, fever, malaise/fatigue and weight loss.  HENT: Negative for ear discharge, ear pain and sore throat.   Eyes: Negative for blurred vision.  Respiratory: Negative for cough, sputum production, shortness of breath and wheezing.   Cardiovascular: Negative for chest pain, palpitations, orthopnea, leg swelling and PND.  Gastrointestinal: Negative for abdominal pain, blood in stool, constipation, diarrhea, heartburn, melena and nausea.  Genitourinary: Negative for dysuria, frequency, hematuria and urgency.  Musculoskeletal: Negative for back pain, joint pain, myalgias and neck pain.  Skin: Negative for rash.  Neurological: Negative for dizziness, tingling, sensory change, focal weakness and headaches.  Endo/Heme/Allergies: Negative for environmental allergies and polydipsia. Does not bruise/bleed easily.  Psychiatric/Behavioral: Negative for depression and suicidal ideas. The patient is not nervous/anxious and does not have insomnia.      Objective  Vitals:   09/18/16 1024  BP: (!) 148/90  Pulse: 72  Weight: 169 lb (76.7 kg)  Height: 5\' 8"  (1.727 m)    Physical Exam  Constitutional: He is oriented to person, place, and time and well-developed, well-nourished, and in no distress.  HENT:  Head: Normocephalic.  Right Ear: External ear  normal.  Left Ear: External ear normal.  Nose: Nose normal.  Mouth/Throat: Oropharynx is clear and moist.  Eyes: Pupils are equal, round, and reactive to light. Conjunctivae and EOM are normal. Right eye exhibits no discharge. Left eye exhibits no discharge. No scleral icterus.  Neck: Normal range of motion. Neck supple. No JVD present. No  tracheal deviation present. No thyromegaly present.  Cardiovascular: Normal rate, regular rhythm, normal heart sounds and intact distal pulses.  Exam reveals no gallop and no friction rub.   No murmur heard. Pulmonary/Chest: Breath sounds normal. No respiratory distress. He has no wheezes. He has no rales.  Abdominal: Soft. Bowel sounds are normal. He exhibits no mass. There is no hepatosplenomegaly. There is no tenderness. There is no rebound, no guarding and no CVA tenderness.  Musculoskeletal: Normal range of motion. He exhibits no edema or tenderness.  Lymphadenopathy:    He has no cervical adenopathy.  Neurological: He is alert and oriented to person, place, and time. He has normal sensation, normal strength, normal reflexes and intact cranial nerves. No cranial nerve deficit.  Skin: Skin is warm. No rash noted.  Psychiatric: Mood and affect normal.  Nursing note and vitals reviewed.     Assessment & Plan  Problem List Items Addressed This Visit      Cardiovascular and Mediastinum   Essential hypertension - Primary   Relevant Medications   hydrochlorothiazide (HYDRODIURIL) 25 MG tablet   atorvastatin (LIPITOR) 10 MG tablet     Other   Pure hypercholesterolemia   Relevant Medications   hydrochlorothiazide (HYDRODIURIL) 25 MG tablet   atorvastatin (LIPITOR) 10 MG tablet      Meds ordered this encounter  Medications  . hydrochlorothiazide (HYDRODIURIL) 25 MG tablet    Sig: Take 1 tablet (25 mg total) by mouth daily.    Dispense:  90 tablet    Refill:  3  . atorvastatin (LIPITOR) 10 MG tablet    Sig: Take 1 tablet (10 mg total) by mouth daily.    Dispense:  90 tablet    Refill:  3      Dr. Elizabeth Sauer [facility]  09/18/16

## 2016-10-03 ENCOUNTER — Encounter: Payer: Self-pay | Admitting: Family Medicine

## 2016-10-03 ENCOUNTER — Ambulatory Visit (INDEPENDENT_AMBULATORY_CARE_PROVIDER_SITE_OTHER): Payer: BC Managed Care – PPO | Admitting: Family Medicine

## 2016-10-03 VITALS — BP 138/88 | HR 88 | Ht 68.0 in | Wt 168.0 lb

## 2016-10-03 DIAGNOSIS — E7800 Pure hypercholesterolemia, unspecified: Secondary | ICD-10-CM | POA: Diagnosis not present

## 2016-10-03 DIAGNOSIS — Z23 Encounter for immunization: Secondary | ICD-10-CM

## 2016-10-03 DIAGNOSIS — I10 Essential (primary) hypertension: Secondary | ICD-10-CM | POA: Diagnosis not present

## 2016-10-03 MED ORDER — AMLODIPINE BESYLATE 5 MG PO TABS: 5.0000 mg | ORAL_TABLET | Freq: Every day | ORAL | 1 refills | Status: DC

## 2016-10-03 MED ORDER — HYDROCHLOROTHIAZIDE 25 MG PO TABS: 25.0000 mg | ORAL_TABLET | Freq: Every day | ORAL | 1 refills | Status: DC

## 2016-10-03 NOTE — Progress Notes (Signed)
Name: Christopher Browning   MRN: [id number]    DOB: October 10, 1950   Date:10/03/2016       Progress Note  Subjective  Chief Complaint  Chief Complaint  Patient presents with  . Follow-up    follow up b/p check    Hypertension  This is a new problem. The current episode started more than 1 year ago. The problem has been gradually improving since onset. The problem is controlled. Pertinent negatives include no anxiety, blurred vision, chest pain, headaches, malaise/fatigue, neck pain, orthopnea, palpitations, peripheral edema, PND, shortness of breath or sweats. There are no associated agents to hypertension. Risk factors for coronary artery disease include dyslipidemia. Past treatments include calcium channel blockers and diuretics. The current treatment provides moderate improvement. There are no compliance problems.  There is no history of angina, kidney disease, CAD/MI, CVA, heart failure, left ventricular hypertrophy, PVD or retinopathy. There is no history of chronic renal disease, a hypertension causing med or renovascular disease.  Hyperlipidemia  This is a new problem. This is a new diagnosis. Recent lipid tests were reviewed and are high. He has no history of chronic renal disease, diabetes, hypothyroidism, liver disease, obesity or nephrotic syndrome. Factors aggravating his hyperlipidemia include thiazides. Pertinent negatives include no chest pain, focal weakness, myalgias or shortness of breath. Current antihyperlipidemic treatment includes statins. Compliance problems include medication side effects.  Risk factors for coronary artery disease include dyslipidemia.    No problem-specific Assessment & Plan notes found for this encounter.   Past Medical History:  Diagnosis Date  . Hypertension     Past Surgical History:  Procedure Laterality Date  . COLONOSCOPY  2011   cleared for 5 yrs-    No family history on file.  Social History   Social History  . Marital status: Married     Spouse name: N/A  . Number of children: N/A  . Years of education: N/A   Occupational History  . Not on file.   Social History Main Topics  . Smoking status: Never Smoker  . Smokeless tobacco: Never Used  . Alcohol use No  . Drug use: No  . Sexual activity: Not on file   Other Topics Concern  . Not on file   Social History Narrative  . No narrative on file    No Known Allergies  Outpatient Medications Prior to Visit  Medication Sig Dispense Refill  . amLODipine (NORVASC) 5 MG tablet Take 1 tablet (5 mg total) by mouth daily. 90 tablet 1  . atorvastatin (LIPITOR) 10 MG tablet Take 1 tablet (10 mg total) by mouth daily. (Patient taking differently: Take 5 mg by mouth daily. ) 90 tablet 3  . hydrochlorothiazide (HYDRODIURIL) 25 MG tablet Take 1 tablet (25 mg total) by mouth daily. 90 tablet 3   No facility-administered medications prior to visit.     Review of Systems  Constitutional: Negative for chills, fever, malaise/fatigue and weight loss.  HENT: Negative for ear discharge, ear pain and sore throat.   Eyes: Negative for blurred vision.  Respiratory: Negative for cough, sputum production, shortness of breath and wheezing.   Cardiovascular: Negative for chest pain, palpitations, orthopnea, leg swelling and PND.  Gastrointestinal: Negative for abdominal pain, blood in stool, constipation, diarrhea, heartburn, melena and nausea.  Genitourinary: Negative for dysuria, frequency, hematuria and urgency.  Musculoskeletal: Negative for back pain, joint pain, myalgias and neck pain.  Skin: Negative for rash.  Neurological: Negative for dizziness, tingling, sensory change, focal weakness and headaches.  Endo/Heme/Allergies: Negative for environmental allergies and polydipsia. Does not bruise/bleed easily.  Psychiatric/Behavioral: Negative for depression and suicidal ideas. The patient is not nervous/anxious and does not have insomnia.      Objective  Vitals:   10/03/16 0823   BP: 138/88  Pulse: 88  Weight: 168 lb (76.2 kg)  Height:  (1.727 m)    Physical Exam  Constitutional: He is oriented to person, place, and time and well-developed, well-nourished, and in no distress.  HENT:  Head: Normocephalic.  Right Ear: External ear normal.  Left Ear: External ear normal.  Nose: Nose normal.  Mouth/Throat: Oropharynx is clear and moist.  Eyes: Pupils are equal, round, and reactive to light. Conjunctivae and EOM are normal. Right eye exhibits no discharge. Left eye exhibits no discharge. No scleral icterus.  Neck: Normal range of motion. Neck supple. No JVD present. No tracheal deviation present. No thyromegaly present.  Cardiovascular: Normal rate, regular rhythm, normal heart sounds and intact distal pulses.  Exam reveals no gallop and no friction rub.   No murmur heard. Pulmonary/Chest: Breath sounds normal. No respiratory distress. He has no wheezes. He has no rales.  Abdominal: Soft. Bowel sounds are normal. He exhibits no mass. There is no hepatosplenomegaly. There is no tenderness. There is no rebound, no guarding and no CVA tenderness.  Musculoskeletal: Normal range of motion. He exhibits no edema or tenderness.  Lymphadenopathy:    He has no cervical adenopathy.  Neurological: He is alert and oriented to person, place, and time. He has normal sensation, normal strength and intact cranial nerves. No cranial nerve deficit.  Skin: Skin is warm. No rash noted.  Psychiatric: Mood and affect normal.  Nursing note and vitals reviewed.     Assessment & Plan  Problem List Items Addressed This Visit      Cardiovascular and Mediastinum   Essential hypertension - Primary   Relevant Medications   amLODipine (NORVASC) 5 MG tablet   hydrochlorothiazide (HYDRODIURIL) 25 MG tablet     Other   Pure hypercholesterolemia   Relevant Medications   amLODipine (NORVASC) 5 MG tablet   hydrochlorothiazide (HYDRODIURIL) 25 MG tablet    Other Visit Diagnoses     Influenza vaccine needed       Relevant Orders   Flu Vaccine QUAD 36+ mos IM (Completed)      Meds ordered this encounter  Medications  . amLODipine (NORVASC) 5 MG tablet    Sig: Take 1 tablet (5 mg total) by mouth daily.    Dispense:  90 tablet    Refill:  1  . hydrochlorothiazide (HYDRODIURIL) 25 MG tablet    Sig: Take 1 tablet (25 mg total) by mouth daily.    Dispense:  90 tablet    Refill:  1      Dr. Elizabeth Sauer [facility]  10/03/16

## 2016-10-03 NOTE — Patient Instructions (Signed)

## 2016-10-22 ENCOUNTER — Other Ambulatory Visit: Payer: Self-pay | Admitting: Neurology

## 2016-10-22 DIAGNOSIS — I639 Cerebral infarction, unspecified: Secondary | ICD-10-CM

## 2016-10-23 ENCOUNTER — Ambulatory Visit: Payer: BC Managed Care – PPO | Admitting: Family Medicine

## 2016-10-29 ENCOUNTER — Ambulatory Visit
Admission: RE | Admit: 2016-10-29 | Discharge: 2016-10-29 | Disposition: A | Discharge status: Home / Self Care | Payer: BC Managed Care – PPO | Source: Ambulatory Visit | Attending: Neurology | Admitting: Neurology

## 2016-10-29 DIAGNOSIS — I639 Cerebral infarction, unspecified: Secondary | ICD-10-CM

## 2016-10-29 DIAGNOSIS — Z8673 Personal history of transient ischemic attack (TIA), and cerebral infarction without residual deficits: Secondary | ICD-10-CM | POA: Insufficient documentation

## 2016-10-29 DIAGNOSIS — G939 Disorder of brain, unspecified: Secondary | ICD-10-CM | POA: Diagnosis not present

## 2016-10-29 DIAGNOSIS — G459 Transient cerebral ischemic attack, unspecified: Secondary | ICD-10-CM | POA: Diagnosis present

## 2017-02-04 ENCOUNTER — Encounter: Payer: Self-pay | Admitting: Family Medicine

## 2017-02-04 ENCOUNTER — Ambulatory Visit: Payer: BC Managed Care – PPO | Admitting: Family Medicine

## 2017-02-04 VITALS — BP 132/80 | HR 100 | Ht 68.0 in | Wt 162.0 lb

## 2017-02-04 DIAGNOSIS — E7800 Pure hypercholesterolemia, unspecified: Secondary | ICD-10-CM | POA: Diagnosis not present

## 2017-02-04 DIAGNOSIS — Z8679 Personal history of other diseases of the circulatory system: Secondary | ICD-10-CM

## 2017-02-04 DIAGNOSIS — Z1159 Encounter for screening for other viral diseases: Secondary | ICD-10-CM | POA: Diagnosis not present

## 2017-02-04 DIAGNOSIS — I6523 Occlusion and stenosis of bilateral carotid arteries: Secondary | ICD-10-CM | POA: Diagnosis not present

## 2017-02-04 DIAGNOSIS — I679 Cerebrovascular disease, unspecified: Secondary | ICD-10-CM

## 2017-02-04 DIAGNOSIS — I10 Essential (primary) hypertension: Secondary | ICD-10-CM | POA: Diagnosis not present

## 2017-02-04 MED ORDER — HYDROCHLOROTHIAZIDE 25 MG PO TABS: 25.0000 mg | ORAL_TABLET | Freq: Every day | ORAL | 1 refills | Status: DC

## 2017-02-04 MED ORDER — AMLODIPINE BESYLATE 5 MG PO TABS: 5.0000 mg | ORAL_TABLET | Freq: Every day | ORAL | 1 refills | Status: DC

## 2017-02-04 MED ORDER — ASPIRIN EC 81 MG PO TBEC: 81.0000 mg | DELAYED_RELEASE_TABLET | Freq: Every day | ORAL | 3 refills | Status: DC

## 2017-02-04 NOTE — Progress Notes (Signed)
Name: Christopher Browning   MRN: [id number]    DOB: 1950-07-30   Date:02/04/2017       Progress Note  Subjective  Chief Complaint  Chief Complaint  Patient presents with  . Hypertension    Hypertension  This is a chronic problem. The current episode started more than 1 year ago. The problem is unchanged. The problem is controlled. Pertinent negatives include no anxiety, blurred vision, chest pain, headaches, malaise/fatigue, neck pain, orthopnea, palpitations, peripheral edema, PND, shortness of breath or sweats. There are no associated agents to hypertension. There are no known risk factors for coronary artery disease. Past treatments include diuretics and calcium channel blockers. There is no history of angina, kidney disease, CAD/MI, CVA, heart failure, left ventricular hypertrophy, PVD or retinopathy. There is no history of chronic renal disease, a hypertension causing med or renovascular disease.  Neurologic Problem  The patient's pertinent negatives include no altered mental status, clumsiness, focal sensory loss, focal weakness, loss of balance, memory loss, near-syncope, slurred speech, syncope, visual change or weakness. Primary symptoms comment: for cerebral vasc. This is a chronic problem. The problem has been gradually improving since onset. Pertinent negatives include no abdominal pain, aura, back pain, chest pain, diaphoresis, dizziness, fatigue, fever, headaches, light-headedness, nausea, neck pain, palpitations or shortness of breath. Treatments tried: asa.    No problem-specific Assessment & Plan notes found for this encounter.   Past Medical History:  Diagnosis Date  . Hypertension     Past Surgical History:  Procedure Laterality Date  . COLONOSCOPY  2011   cleared for 5 yrs-    History reviewed. No pertinent family history.  Social History   Socioeconomic History  . Marital status: Married    Spouse name: Not on file  . Number of children: Not on file  . Years of  education: Not on file  . Highest education level: Not on file  Social Needs  . Financial resource strain: Not on file  . Food insecurity - worry: Not on file  . Food insecurity - inability: Not on file  . Transportation needs - medical: Not on file  . Transportation needs - non-medical: Not on file  Occupational History  . Not on file  Tobacco Use  . Smoking status: Never Smoker  . Smokeless tobacco: Never Used  Substance and Sexual Activity  . Alcohol use: No    Alcohol/week: 0.0 oz  . Drug use: No  . Sexual activity: Not on file  Other Topics Concern  . Not on file  Social History Narrative  . Not on file    No Known Allergies  Outpatient Medications Prior to Visit  Medication Sig Dispense Refill  . amLODipine (NORVASC) 5 MG tablet Take 1 tablet (5 mg total) by mouth daily. 90 tablet 1  . aspirin EC 81 MG tablet Take 81 mg by mouth daily.    . hydrochlorothiazide (HYDRODIURIL) 25 MG tablet Take 1 tablet (25 mg total) by mouth daily. 90 tablet 1   No facility-administered medications prior to visit.     Review of Systems  Constitutional: Negative for chills, diaphoresis, fatigue, fever, malaise/fatigue and weight loss.  HENT: Negative for ear discharge, ear pain and sore throat.   Eyes: Negative for blurred vision.  Respiratory: Negative for cough, sputum production, shortness of breath and wheezing.   Cardiovascular: Negative for chest pain, palpitations, orthopnea, leg swelling, PND and near-syncope.  Gastrointestinal: Negative for abdominal pain, blood in stool, constipation, diarrhea, heartburn, melena and nausea.  Genitourinary:  Negative for dysuria, frequency, hematuria and urgency.  Musculoskeletal: Negative for back pain, joint pain, myalgias and neck pain.  Skin: Negative for rash.  Neurological: Negative for dizziness, tingling, sensory change, focal weakness, syncope, weakness, light-headedness, headaches and loss of balance.  Endo/Heme/Allergies: Negative  for environmental allergies and polydipsia. Does not bruise/bleed easily.  Psychiatric/Behavioral: Negative for depression, memory loss and suicidal ideas. The patient is not nervous/anxious and does not have insomnia.      Objective  Vitals:   02/04/17 0819  BP: 132/80  Pulse: 100  Weight: 162 lb (73.5 kg)  Height: 5\' 8"  (1.727 m)    Physical Exam  Constitutional: He is oriented to person, place, and time and well-developed, well-nourished, and in no distress.  HENT:  Head: Normocephalic.  Right Ear: External ear normal.  Left Ear: External ear normal.  Nose: Nose normal.  Mouth/Throat: Oropharynx is clear and moist.  Eyes: Conjunctivae and EOM are normal. Pupils are equal, round, and reactive to light. Right eye exhibits no discharge. Left eye exhibits no discharge. No scleral icterus.  Neck: Normal range of motion. Neck supple. No JVD present. No tracheal deviation present. No thyromegaly present.  Cardiovascular: Normal rate, regular rhythm, S1 normal, S2 normal, normal heart sounds and intact distal pulses. Exam reveals no gallop, no S3, no S4 and no friction rub.  No murmur heard. Pulmonary/Chest: Breath sounds normal. No respiratory distress. He has no wheezes. He has no rales.  Abdominal: Soft. Bowel sounds are normal. He exhibits no mass. There is no hepatosplenomegaly. There is no tenderness. There is no rebound, no guarding and no CVA tenderness.  Musculoskeletal: Normal range of motion. He exhibits no edema or tenderness.  Lymphadenopathy:    He has no cervical adenopathy.  Neurological: He is alert and oriented to person, place, and time. He has normal sensation, normal strength, normal reflexes and intact cranial nerves. No cranial nerve deficit.  Skin: Skin is warm. No rash noted.  Psychiatric: Mood and affect normal.  Nursing note and vitals reviewed.     Assessment & Plan  Problem List Items Addressed This Visit      Cardiovascular and Mediastinum    Essential hypertension - Primary   Relevant Medications   hydrochlorothiazide (HYDRODIURIL) 25 MG tablet   amLODipine (NORVASC) 5 MG tablet   aspirin EC 81 MG tablet   Other Relevant Orders   Renal Function Panel   Atherosclerosis of both carotid arteries   Relevant Medications   hydrochlorothiazide (HYDRODIURIL) 25 MG tablet   amLODipine (NORVASC) 5 MG tablet   aspirin EC 81 MG tablet   Other Relevant Orders   Lipid panel   Cerebral vascular disease   Relevant Medications   hydrochlorothiazide (HYDRODIURIL) 25 MG tablet   amLODipine (NORVASC) 5 MG tablet   aspirin EC 81 MG tablet     Other   Pure hypercholesterolemia   Relevant Medications   hydrochlorothiazide (HYDRODIURIL) 25 MG tablet   amLODipine (NORVASC) 5 MG tablet   aspirin EC 81 MG tablet   History of atrial fibrillation   Relevant Medications   aspirin EC 81 MG tablet    Other Visit Diagnoses    Need for hepatitis C screening test       Relevant Orders   Hepatitis C antibody      Meds ordered this encounter  Medications  . hydrochlorothiazide (HYDRODIURIL) 25 MG tablet    Sig: Take 1 tablet (25 mg total) by mouth daily.    Dispense:  90 tablet  Refill:  1  . amLODipine (NORVASC) 5 MG tablet    Sig: Take 1 tablet (5 mg total) by mouth daily.    Dispense:  90 tablet    Refill:  1  . aspirin EC 81 MG tablet    Sig: Take 1 tablet (81 mg total) by mouth daily.    Dispense:  100 tablet    Refill:  3      Dr. Elizabeth Sauer [facility]  02/04/17

## 2017-02-05 LAB — RENAL FUNCTION PANEL
Albumin: 5 g/dL — ABNORMAL HIGH (ref 3.6–4.8)
BUN / CREAT RATIO: 16 (ref 10–24)
BUN: 16 mg/dL (ref 8–27)
CHLORIDE: 95 mmol/L — AB (ref 96–106)
CO2: 28 mmol/L (ref 20–29)
Calcium: 10.5 mg/dL — ABNORMAL HIGH (ref 8.6–10.2)
Creatinine, Ser: 0.98 mg/dL (ref 0.76–1.27)
GFR calc non Af Amer: 81 mL/min/{1.73_m2} (ref >59)
GFR, EST AFRICAN AMERICAN: 94 mL/min/{1.73_m2} (ref >59)
GLUCOSE: 78 mg/dL (ref 65–99)
POTASSIUM: 4.1 mmol/L (ref 3.5–5.2)
Phosphorus: 2.4 mg/dL — ABNORMAL LOW (ref 2.5–4.5)
SODIUM: 140 mmol/L (ref 134–144)

## 2017-02-05 LAB — LIPID PANEL
Chol/HDL Ratio: 4.4 ratio (ref 0.0–5.0)
Cholesterol, Total: 231 mg/dL — ABNORMAL HIGH (ref 100–199)
HDL: 53 mg/dL (ref >39)
LDL Calculated: 155 mg/dL — ABNORMAL HIGH (ref 0–99)
Triglycerides: 113 mg/dL (ref 0–149)
VLDL CHOLESTEROL CAL: 23 mg/dL (ref 5–40)

## 2017-02-05 LAB — HEPATITIS C ANTIBODY: Hep C Virus Ab: <0.1 s/co ratio (ref 0.0–0.9)

## 2017-08-04 ENCOUNTER — Ambulatory Visit: Payer: Medicare Other | Admitting: Family Medicine

## 2017-08-04 ENCOUNTER — Encounter: Payer: Self-pay | Admitting: Family Medicine

## 2017-08-04 VITALS — BP 150/88 | HR 80 | Ht 68.0 in | Wt 160.0 lb

## 2017-08-04 DIAGNOSIS — Z23 Encounter for immunization: Secondary | ICD-10-CM | POA: Diagnosis not present

## 2017-08-04 DIAGNOSIS — E7800 Pure hypercholesterolemia, unspecified: Secondary | ICD-10-CM | POA: Diagnosis not present

## 2017-08-04 DIAGNOSIS — I10 Essential (primary) hypertension: Secondary | ICD-10-CM

## 2017-08-04 MED ORDER — HYDROCHLOROTHIAZIDE 25 MG PO TABS: 25.0000 mg | ORAL_TABLET | Freq: Every day | ORAL | 1 refills | Status: DC

## 2017-08-04 MED ORDER — AMLODIPINE BESYLATE 10 MG PO TABS: 10.0000 mg | ORAL_TABLET | Freq: Every day | ORAL | 1 refills | Status: DC

## 2017-08-04 MED ORDER — ATORVASTATIN CALCIUM 10 MG PO TABS: 10.0000 mg | ORAL_TABLET | Freq: Every day | ORAL | 1 refills | Status: DC

## 2017-08-04 MED ORDER — AMLODIPINE BESYLATE 5 MG PO TABS: 5.0000 mg | ORAL_TABLET | Freq: Every day | ORAL | 1 refills | Status: DC

## 2017-08-04 NOTE — Assessment & Plan Note (Addendum)
Blood pressure gradually increasing Will increase Amlodipine to 10mg  qday and maintantain HCTZ 25mg .

## 2017-08-04 NOTE — Assessment & Plan Note (Addendum)
Chronic stable Continue atorvastatin as prescribed- stable on meds/ check lipid panel

## 2017-08-04 NOTE — Progress Notes (Signed)
Name: Christopher Browning   MRN: [id number]    DOB: [birth year]   Date:08/04/2017       Progress Note  Subjective  Chief Complaint  Chief Complaint  Patient presents with  . Hypertension  . Hyperlipidemia    Hypertension  This is a chronic problem. The current episode started more than 1 year ago. The problem is unchanged. The problem is uncontrolled. Pertinent negatives include no anxiety, blurred vision, chest pain, headaches, malaise/fatigue, neck pain, orthopnea, palpitations, peripheral edema, PND, shortness of breath or sweats. There are no associated agents to hypertension. Risk factors for coronary artery disease include dyslipidemia, male gender and post-menopausal state. Past treatments include calcium channel blockers and diuretics. The current treatment provides moderate improvement. There are no compliance problems.  There is no history of angina, kidney disease, CAD/MI, CVA, heart failure, left ventricular hypertrophy, PVD or retinopathy. There is no history of chronic renal disease, a hypertension causing med or renovascular disease.    Essential hypertension Blood pressure gradually increasing Will increase Amlodipine to 10mg  qday and maintantain HCTZ 25mg .  Pure hypercholesterolemia Chronic stable Continue atorvastatin as prescribed- stable on meds/ check lipid panel   Past Medical History:  Diagnosis Date  . Hypertension     Past Surgical History:  Procedure Laterality Date  . COLONOSCOPY  2011   cleared for 5 yrs-    History reviewed. No pertinent family history.  Social History   Socioeconomic History  . Marital status: Married    Spouse name: Not on file  . Number of children: Not on file  . Years of education: Not on file  . Highest education level: Not on file  Occupational History  . Not on file  Social Needs  . Financial resource strain: Not on file  . Food insecurity:    Worry: Not on file    Inability: Not on file  . Transportation needs:     Medical: Not on file    Non-medical: Not on file  Tobacco Use  . Smoking status: Never Smoker  . Smokeless tobacco: Never Used  Substance and Sexual Activity  . Alcohol use: No    Alcohol/week: 0.0 oz  . Drug use: No  . Sexual activity: Not on file  Lifestyle  . Physical activity:    Days per week: Not on file    Minutes per session: Not on file  . Stress: Not on file  Relationships  . Social connections:    Talks on phone: Not on file    Gets together: Not on file    Attends religious service: Not on file    Active member of club or organization: Not on file    Attends meetings of clubs or organizations: Not on file    Relationship status: Not on file  . Intimate partner violence:    Fear of current or ex partner: Not on file    Emotionally abused: Not on file    Physically abused: Not on file    Forced sexual activity: Not on file  Other Topics Concern  . Not on file  Social History Narrative  . Not on file    No Known Allergies  Outpatient Medications Prior to Visit  Medication Sig Dispense Refill  . aspirin EC 81 MG tablet Take 1 tablet (81 mg total) by mouth daily. 100 tablet 3  . amLODipine (NORVASC) 5 MG tablet Take 1 tablet (5 mg total) by mouth daily. 90 tablet 1  . atorvastatin (LIPITOR) 10 MG tablet  Take 1 tablet by mouth daily.    . hydrochlorothiazide (HYDRODIURIL) 25 MG tablet Take 1 tablet (25 mg total) by mouth daily. 90 tablet 1   No facility-administered medications prior to visit.     Review of Systems  Constitutional: Negative for chills, fever, malaise/fatigue and weight loss.  HENT: Negative for ear discharge, ear pain and sore throat.   Eyes: Negative for blurred vision.  Respiratory: Negative for cough, sputum production, shortness of breath and wheezing.   Cardiovascular: Negative for chest pain, palpitations, orthopnea, leg swelling and PND.  Gastrointestinal: Negative for abdominal pain, blood in stool, constipation, diarrhea, heartburn,  melena and nausea.  Genitourinary: Negative for dysuria, frequency, hematuria and urgency.  Musculoskeletal: Negative for back pain, joint pain, myalgias and neck pain.  Skin: Negative for rash.  Neurological: Negative for dizziness, tingling, sensory change, focal weakness and headaches.  Endo/Heme/Allergies: Negative for environmental allergies and polydipsia. Does not bruise/bleed easily.  Psychiatric/Behavioral: Negative for depression and suicidal ideas. The patient is not nervous/anxious and does not have insomnia.      Objective  Vitals:   08/04/17 0817  BP: (!) 150/88  Pulse: 80  Weight: 160 lb (72.6 kg)  Height: 5\' 8"  (1.727 m)    Physical Exam  Constitutional: He is oriented to person, place, and time.  HENT:  Head: Normocephalic.  Right Ear: External ear normal.  Left Ear: External ear normal.  Nose: Nose normal.  Mouth/Throat: Oropharynx is clear and moist.  Eyes: Pupils are equal, round, and reactive to light. Conjunctivae and EOM are normal. Right eye exhibits no discharge. Left eye exhibits no discharge. No scleral icterus.  Neck: Normal range of motion. Neck supple. No JVD present. No tracheal deviation present. No thyromegaly present.  Cardiovascular: Normal rate, regular rhythm, normal heart sounds and intact distal pulses. Exam reveals no gallop and no friction rub.  No murmur heard. Pulmonary/Chest: Breath sounds normal. No respiratory distress. He has no wheezes. He has no rales.  Abdominal: Soft. Bowel sounds are normal. He exhibits no mass. There is no hepatosplenomegaly. There is no tenderness. There is no rebound, no guarding and no CVA tenderness.  Musculoskeletal: Normal range of motion. He exhibits no edema or tenderness.  Lymphadenopathy:    He has no cervical adenopathy.  Neurological: He is alert and oriented to person, place, and time. He has normal strength and normal reflexes. No cranial nerve deficit.  Skin: Skin is warm. No rash noted.   Nursing note and vitals reviewed.     Assessment & Plan  Problem List Items Addressed This Visit      Cardiovascular and Mediastinum   Essential hypertension - Primary    Blood pressure gradually increasing Will increase Amlodipine to 10mg  qday and maintantain HCTZ 25mg .      Relevant Medications   amLODipine (NORVASC) 10 MG tablet   atorvastatin (LIPITOR) 10 MG tablet   hydrochlorothiazide (HYDRODIURIL) 25 MG tablet   Other Relevant Orders   Renal Function Panel     Other   Pure hypercholesterolemia    Chronic stable Continue atorvastatin as prescribed- stable on meds/ check lipid panel      Relevant Medications   amLODipine (NORVASC) 10 MG tablet   atorvastatin (LIPITOR) 10 MG tablet   hydrochlorothiazide (HYDRODIURIL) 25 MG tablet   Other Relevant Orders   Lipid panel    Other Visit Diagnoses    Need for vaccination against Streptococcus pneumoniae using pneumococcal conjugate vaccine 13       Needs pneumococcal  13 vaccine/administered   Relevant Orders   Pneumococcal conjugate vaccine 13-valent IM (Completed)      Meds ordered this encounter  Medications  . amLODipine (NORVASC) 10 MG tablet    Sig: Take 1 tablet (10 mg total) by mouth daily.    Dispense:  90 tablet    Refill:  1  . atorvastatin (LIPITOR) 10 MG tablet    Sig: Take 1 tablet (10 mg total) by mouth daily.    Dispense:  90 tablet    Refill:  1  . DISCONTD: amLODipine (NORVASC) 5 MG tablet    Sig: Take 1 tablet (5 mg total) by mouth daily.    Dispense:  90 tablet    Refill:  1  . hydrochlorothiazide (HYDRODIURIL) 25 MG tablet    Sig: Take 1 tablet (25 mg total) by mouth daily.    Dispense:  90 tablet    Refill:  1      Dr. Elizabeth Sauer [facility]  08/04/17

## 2017-08-05 ENCOUNTER — Other Ambulatory Visit: Payer: Self-pay

## 2017-08-05 LAB — RENAL FUNCTION PANEL
Albumin: 4.6 g/dL (ref 3.6–4.8)
BUN / CREAT RATIO: 16 (ref 10–24)
BUN: 19 mg/dL (ref 8–27)
CALCIUM: 10.1 mg/dL (ref 8.6–10.2)
CO2: 26 mmol/L (ref 20–29)
CREATININE: 1.17 mg/dL (ref 0.76–1.27)
Chloride: 95 mmol/L — ABNORMAL LOW (ref 96–106)
GFR calc Af Amer: 75 mL/min/{1.73_m2} (ref >59)
GFR, EST NON AFRICAN AMERICAN: 65 mL/min/{1.73_m2} (ref >59)
Glucose: 85 mg/dL (ref 65–99)
Phosphorus: 2.5 mg/dL (ref 2.5–4.5)
Potassium: 2.9 mmol/L — ABNORMAL LOW (ref 3.5–5.2)
SODIUM: 142 mmol/L (ref 134–144)

## 2017-08-05 LAB — LIPID PANEL
CHOL/HDL RATIO: 2.8 ratio (ref 0.0–5.0)
CHOLESTEROL TOTAL: 132 mg/dL (ref 100–199)
HDL: 47 mg/dL (ref >39)
LDL CALC: 69 mg/dL (ref 0–99)
Triglycerides: 78 mg/dL (ref 0–149)
VLDL Cholesterol Cal: 16 mg/dL (ref 5–40)

## 2017-08-05 MED ORDER — POTASSIUM CHLORIDE ER 20 MEQ PO TBCR: 1.0000 | EXTENDED_RELEASE_TABLET | Freq: Every day | ORAL | 5 refills | Status: DC

## 2017-11-04 ENCOUNTER — Ambulatory Visit: Payer: Medicare Other | Admitting: Family Medicine

## 2017-11-04 ENCOUNTER — Encounter: Payer: Self-pay | Admitting: Family Medicine

## 2017-11-04 VITALS — BP 122/82 | HR 82 | Resp 16 | Ht 68.0 in | Wt 167.4 lb

## 2017-11-04 DIAGNOSIS — Z23 Encounter for immunization: Secondary | ICD-10-CM

## 2017-11-04 DIAGNOSIS — E876 Hypokalemia: Secondary | ICD-10-CM | POA: Diagnosis not present

## 2017-11-04 DIAGNOSIS — E7800 Pure hypercholesterolemia, unspecified: Secondary | ICD-10-CM

## 2017-11-04 DIAGNOSIS — I679 Cerebrovascular disease, unspecified: Secondary | ICD-10-CM

## 2017-11-04 DIAGNOSIS — I10 Essential (primary) hypertension: Secondary | ICD-10-CM | POA: Diagnosis not present

## 2017-11-04 MED ORDER — POTASSIUM CHLORIDE ER 20 MEQ PO TBCR: 1.0000 | EXTENDED_RELEASE_TABLET | Freq: Every day | ORAL | 1 refills | Status: DC

## 2017-11-04 MED ORDER — AMLODIPINE BESYLATE 10 MG PO TABS: 10.0000 mg | ORAL_TABLET | Freq: Every day | ORAL | 1 refills | Status: DC

## 2017-11-04 MED ORDER — ATORVASTATIN CALCIUM 10 MG PO TABS
10.0000 mg | ORAL_TABLET | Freq: Every day | ORAL | 1 refills | Status: DC
Start: 2017-11-04 — End: 2018-02-04

## 2017-11-04 MED ORDER — HYDROCHLOROTHIAZIDE 25 MG PO TABS: 25.0000 mg | ORAL_TABLET | Freq: Every day | ORAL | 1 refills | Status: DC

## 2017-11-04 NOTE — Progress Notes (Signed)
Date:  11/04/2017   Name:  Christopher Browning   DOB:  Mar 18, 1950   MRN:  [id number]   Chief Complaint: Hypertension Hypertension  This is a chronic problem. The current episode started more than 1 year ago. The problem has been gradually improving since onset. The problem is controlled. Pertinent negatives include no anxiety, blurred vision, chest pain, headaches, malaise/fatigue, neck pain, orthopnea, palpitations, peripheral edema, PND, shortness of breath or sweats. There are no associated agents to hypertension. Risk factors for coronary artery disease include male gender. Past treatments include diuretics and calcium channel blockers. The current treatment provides moderate improvement. There are no compliance problems.  There is no history of angina, kidney disease, CAD/MI, CVA, heart failure, left ventricular hypertrophy, PVD or retinopathy. tia. There is no history of chronic renal disease, a hypertension causing med or renovascular disease.     Review of Systems  Constitutional: Negative for chills, fever and malaise/fatigue.  HENT: Negative for drooling, ear discharge, ear pain and sore throat.   Eyes: Negative for blurred vision.  Respiratory: Negative for cough, shortness of breath and wheezing.   Cardiovascular: Negative for chest pain, palpitations, orthopnea, leg swelling and PND.  Gastrointestinal: Negative for abdominal pain, blood in stool, constipation, diarrhea and nausea.  Endocrine: Negative for polydipsia.  Genitourinary: Negative for dysuria, frequency, hematuria and urgency.  Musculoskeletal: Negative for back pain, myalgias and neck pain.  Skin: Negative for rash.  Allergic/Immunologic: Negative for environmental allergies.  Neurological: Negative for dizziness and headaches.  Hematological: Does not bruise/bleed easily.  Psychiatric/Behavioral: Negative for suicidal ideas. The patient is not nervous/anxious.     Patient Active Problem List   Diagnosis Date Noted    . Atherosclerosis of both carotid arteries 02/04/2017  . Cerebral vascular disease 02/04/2017  . History of atrial fibrillation 02/04/2017  . 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 Pure hypercholesterolemia 09/11/2016  . Essential hypertension 11/29/2014    No Known Allergies  Past Surgical History:  Procedure Laterality Date  . COLONOSCOPY  2011   cleared for 5 yrs-    Social History   Tobacco Use  . Smoking status: Never Smoker  . Smokeless tobacco: Never Used  Substance Use Topics  . Alcohol use: No    Alcohol/week: 0.0 standard drinks  . Drug use: No     Medication list has been reviewed and updated.  Current Meds  Medication Sig  . amLODipine (NORVASC) 10 MG tablet Take 1 tablet (10 mg total) by mouth daily.  Marland Kitchen aspirin EC 81 MG tablet Take 1 tablet (81 mg total) by mouth daily.  Marland Kitchen atorvastatin (LIPITOR) 10 MG tablet Take 1 tablet (10 mg total) by mouth daily.  . hydrochlorothiazide (HYDRODIURIL) 25 MG tablet Take 1 tablet (25 mg total) by mouth daily.  . Potassium Chloride ER 20 MEQ TBCR Take 1 tablet by mouth daily.  . [DISCONTINUED] amLODipine (NORVASC) 10 MG tablet Take 1 tablet (10 mg total) by mouth daily.  . [DISCONTINUED] atorvastatin (LIPITOR) 10 MG tablet Take 1 tablet (10 mg total) by mouth daily.  . [DISCONTINUED] hydrochlorothiazide (HYDRODIURIL) 25 MG tablet Take 1 tablet (25 mg total) by mouth daily.  . [DISCONTINUED] Potassium Chloride ER 20 MEQ TBCR Take 1 tablet by mouth daily.    PHQ 2/9 Scores 11/04/2017 08/04/2017 08/04/2017 09/11/2016  PHQ - 2 Score 0 0 0 0  PHQ- 9 Score - 0 - 0    Physical Exam  Constitutional: He is oriented to person, place,  and time.  HENT:  Head: Normocephalic.  Right Ear: External ear normal.  Left Ear: External ear normal.  Nose: Nose normal.  Mouth/Throat: Oropharynx is clear and moist.  Eyes: Pupils are equal, round, and reactive to light. Conjunctivae and EOM are normal. Right eye exhibits no  discharge. Left eye exhibits no discharge. No scleral icterus.  Neck: Normal range of motion. Neck supple. No JVD present. No tracheal deviation present. No thyromegaly present.  Cardiovascular: Normal rate, regular rhythm, normal heart sounds and intact distal pulses. Exam reveals no gallop and no friction rub.  No murmur heard. Pulmonary/Chest: Breath sounds normal. No respiratory distress. He has no wheezes. He has no rales.  Abdominal: Soft. Bowel sounds are normal. He exhibits no mass. There is no hepatosplenomegaly. There is no tenderness. There is no rebound, no guarding and no CVA tenderness.  Musculoskeletal: Normal range of motion. He exhibits no edema or tenderness.  Lymphadenopathy:    He has no cervical adenopathy.  Neurological: He is alert and oriented to person, place, and time. He has normal strength and normal reflexes. No cranial nerve deficit.  Skin: Skin is warm. No rash noted.  Nursing note and vitals reviewed.   BP 122/82   Pulse 82   Resp 16   Ht 5\' 8"  (1.727 m)   Wt 167 lb 6.4 oz (75.9 kg)   SpO2 98%   BMI 25.45 kg/m   Assessment and Plan:  1. Need for influenza vaccination Discussed and administered - Flu vaccine HIGH DOSE PF  2. Essential hypertension Recent increase amlodipine to 10 mg,tolerating well and will continue with hctz.  - hydrochlorothiazide (HYDRODIURIL) 25 MG tablet; Take 1 tablet (25 mg total) by mouth daily.  Dispense: 90 tablet; Refill: 1 - amLODipine (NORVASC) 10 MG tablet; Take 1 tablet (10 mg total) by mouth daily.  Dispense: 90 tablet; Refill: 1  3. Pure hypercholesterolemia Chronic Controlled Continue atorvastatin 10mg . - atorvastatin (LIPITOR) 10 MG tablet; Take 1 tablet (10 mg total) by mouth daily.  Dispense: 90 tablet; Refill: 1  4. Cerebral vascular disease Past history of tia with internal caritid atherosclerosis. Cont asa 81 mg.  5. Hypokalemia Secondary to diuretic. Supplement with KCL 20 MEQ. - Potassium Chloride ER  20 MEQ TBCR; Take 1 tablet by mouth daily.  Dispense: 90 tablet; Refill: 1    Dr. [facility]  11/04/2017

## 2018-02-04 ENCOUNTER — Encounter: Payer: Self-pay | Admitting: Family Medicine

## 2018-02-04 ENCOUNTER — Ambulatory Visit: Payer: Medicare Other | Admitting: Family Medicine

## 2018-02-04 VITALS — BP 142/80 | HR 68 | Ht 68.0 in | Wt 165.0 lb

## 2018-02-04 DIAGNOSIS — E876 Hypokalemia: Secondary | ICD-10-CM

## 2018-02-04 DIAGNOSIS — E7800 Pure hypercholesterolemia, unspecified: Secondary | ICD-10-CM | POA: Diagnosis not present

## 2018-02-04 DIAGNOSIS — I10 Essential (primary) hypertension: Secondary | ICD-10-CM

## 2018-02-04 DIAGNOSIS — I679 Cerebrovascular disease, unspecified: Secondary | ICD-10-CM | POA: Diagnosis not present

## 2018-02-04 MED ORDER — POTASSIUM CHLORIDE ER 20 MEQ PO TBCR: 1.0000 | EXTENDED_RELEASE_TABLET | Freq: Every day | ORAL | 1 refills | Status: DC

## 2018-02-04 MED ORDER — AMLODIPINE BESYLATE 10 MG PO TABS: 10.0000 mg | ORAL_TABLET | Freq: Every day | ORAL | 1 refills | Status: DC

## 2018-02-04 MED ORDER — HYDROCHLOROTHIAZIDE 25 MG PO TABS: 25.0000 mg | ORAL_TABLET | Freq: Every day | ORAL | 1 refills | Status: DC

## 2018-02-04 MED ORDER — ATORVASTATIN CALCIUM 10 MG PO TABS
10.0000 mg | ORAL_TABLET | Freq: Every day | ORAL | 1 refills | Status: DC
Start: 2018-02-04 — End: 2018-08-10

## 2018-02-04 NOTE — Progress Notes (Signed)
Date:  02/04/2018   Name:  Christopher Browning   DOB:  May 31, 1950   MRN:  [id number]   Chief Complaint: Hypertension; Hyperlipidemia; and hypokalemia  Patient on potassium supplement on diuretic and controlled  Hypertension  This is a chronic problem. The current episode started more than 1 year ago. The problem is unchanged. The problem is controlled. Pertinent negatives include no anxiety, blurred vision, chest pain, headaches, malaise/fatigue, neck pain, orthopnea, palpitations, peripheral edema, PND, shortness of breath or sweats. There are no associated agents to hypertension. Risk factors for coronary artery disease include dyslipidemia and male gender. Past treatments include calcium channel blockers and diuretics. The current treatment provides moderate improvement. There are no compliance problems.  There is no history of angina, kidney disease, CAD/MI, CVA, heart failure, left ventricular hypertrophy, PVD or retinopathy. There is no history of chronic renal disease, a hypertension causing med or renovascular disease.  Hyperlipidemia  This is a chronic problem. The current episode started more than 1 year ago. The problem is controlled. Recent lipid tests were reviewed and are normal. He has no history of chronic renal disease, diabetes, hypothyroidism, liver disease, obesity or nephrotic syndrome. There are no known factors aggravating his hyperlipidemia. Pertinent negatives include no chest pain, myalgias or shortness of breath. Current antihyperlipidemic treatment includes diet change and statins. The current treatment provides moderate improvement of lipids. There are no compliance problems.  Risk factors for coronary artery disease include hypertension and dyslipidemia.    Review of Systems  Constitutional: Negative for chills, fever and malaise/fatigue.  HENT: Negative for drooling, ear discharge, ear pain and sore throat.   Eyes: Negative for blurred vision.  Respiratory: Negative for  cough, shortness of breath and wheezing.   Cardiovascular: Negative for chest pain, palpitations, orthopnea, leg swelling and PND.  Gastrointestinal: Negative for abdominal pain, blood in stool, constipation, diarrhea and nausea.  Endocrine: Negative for polydipsia.  Genitourinary: Negative for dysuria, frequency, hematuria and urgency.  Musculoskeletal: Negative for back pain, myalgias and neck pain.  Skin: Negative for rash.  Allergic/Immunologic: Negative for environmental allergies.  Neurological: Negative for dizziness and headaches.  Hematological: Does not bruise/bleed easily.  Psychiatric/Behavioral: Negative for suicidal ideas. The patient is not nervous/anxious.     Patient Active Problem List   Diagnosis Date Noted  . Atherosclerosis of both carotid arteries 02/04/2017  . Cerebral vascular disease 02/04/2017  . History of atrial fibrillation 02/04/2017  . 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 Pure hypercholesterolemia 09/11/2016  . Essential hypertension 11/29/2014    No Known Allergies  Past Surgical History:  Procedure Laterality Date  . COLONOSCOPY  2011   cleared for 5 yrs-    Social History   Tobacco Use  . Smoking status: Never Smoker  . Smokeless tobacco: Never Used  Substance Use Topics  . Alcohol use: No    Alcohol/week: 0.0 standard drinks  . Drug use: No     Medication list has been reviewed and updated.  Current Meds  Medication Sig  . amLODipine (NORVASC) 10 MG tablet Take 1 tablet (10 mg total) by mouth daily.  Marland Kitchen. aspirin EC 81 MG tablet Take 1 tablet (81 mg total) by mouth daily.  Marland Kitchen. atorvastatin (LIPITOR) 10 MG tablet Take 1 tablet (10 mg total) by mouth daily.  . hydrochlorothiazide (HYDRODIURIL) 25 MG tablet Take 1 tablet (25 mg total) by mouth daily.  . Potassium Chloride ER 20 MEQ TBCR Take 1 tablet by mouth daily.  PHQ 2/9 Scores 11/04/2017 08/04/2017 08/04/2017 09/11/2016  PHQ - 2 Score 0 0 0 0  PHQ- 9 Score - 0 -  0    Physical Exam HENT:     Head: Normocephalic.     Right Ear: External ear normal.     Left Ear: External ear normal.     Nose: Nose normal.  Eyes:     General: No scleral icterus.       Right eye: No discharge.        Left eye: No discharge.     Conjunctiva/sclera: Conjunctivae normal.     Pupils: Pupils are equal, round, and reactive to light.  Neck:     Musculoskeletal: Normal range of motion and neck supple.     Thyroid: No thyromegaly.     Vascular: No JVD.     Trachea: No tracheal deviation.  Cardiovascular:     Rate and Rhythm: Normal rate and regular rhythm.     Heart sounds: Normal heart sounds. No murmur. No friction rub. No gallop.   Pulmonary:     Effort: No respiratory distress.     Breath sounds: Normal breath sounds. No wheezing or rales.  Abdominal:     General: Bowel sounds are normal.     Palpations: Abdomen is soft. There is no mass.     Tenderness: There is no abdominal tenderness. There is no guarding or rebound.  Musculoskeletal: Normal range of motion.        General: No tenderness.  Lymphadenopathy:     Cervical: No cervical adenopathy.  Skin:    General: Skin is warm.     Findings: No rash.  Neurological:     Mental Status: He is alert and oriented to person, place, and time.     Cranial Nerves: No cranial nerve deficit.     Deep Tendon Reflexes: Reflexes are normal and symmetric.     BP (!) 142/80   Pulse 68   Ht 5\' 8"  (1.727 m)   Wt 165 lb (74.8 kg)   BMI 25.09 kg/m   Assessment and Plan: 1. Essential hypertension Chronic.  Controlled.  Continue hydrochlorothiazide 25 mg daily amlodipine 10 mg once a day and will evaluate renal function with a renal function panel. - hydrochlorothiazide (HYDRODIURIL) 25 MG tablet; Take 1 tablet (25 mg total) by mouth daily.  Dispense: 90 tablet; Refill: 1 - amLODipine (NORVASC) 10 MG tablet; Take 1 tablet (10 mg total) by mouth daily.  Dispense: 90 tablet; Refill: 1 - Renal Function Panel  2.  Hypokalemia Chronic.  Potassium depletion by diuresis.  Continue milliequivalents KCl daily. - Potassium Chloride ER 20 MEQ TBCR; Take 1 tablet by mouth daily.  Dispense: 90 tablet; Refill: 1 - Renal Function Panel  3. Pure hypercholesterolemia Chronic.  Controlled.  Continue atorvastatin 10 mg once a day. - atorvastatin (LIPITOR) 10 MG tablet; Take 1 tablet (10 mg total) by mouth daily.  Dispense: 90 tablet; Refill: 1  4. Cerebral vascular disease Stable.  No progression or new events.  Continue low-dose aspirin 81 mg daily.

## 2018-02-05 LAB — RENAL FUNCTION PANEL
Albumin: 5.1 g/dL — ABNORMAL HIGH (ref 3.6–4.8)
BUN/Creatinine Ratio: 10 (ref 10–24)
BUN: 12 mg/dL (ref 8–27)
CO2: 25 mmol/L (ref 20–29)
CREATININE: 1.15 mg/dL (ref 0.76–1.27)
Calcium: 10.2 mg/dL (ref 8.6–10.2)
Chloride: 96 mmol/L (ref 96–106)
GFR calc Af Amer: 77 mL/min/{1.73_m2} (ref >59)
GFR calc non Af Amer: 66 mL/min/{1.73_m2} (ref >59)
Glucose: 103 mg/dL — ABNORMAL HIGH (ref 65–99)
PHOSPHORUS: 2.7 mg/dL (ref 2.5–4.5)
POTASSIUM: 3.4 mmol/L — AB (ref 3.5–5.2)
SODIUM: 140 mmol/L (ref 134–144)

## 2018-04-19 ENCOUNTER — Ambulatory Visit: Payer: Medicare Other

## 2018-08-10 ENCOUNTER — Encounter: Payer: Self-pay | Admitting: Family Medicine

## 2018-08-10 ENCOUNTER — Ambulatory Visit: Payer: Medicare Other | Admitting: Family Medicine

## 2018-08-10 ENCOUNTER — Other Ambulatory Visit: Payer: Self-pay

## 2018-08-10 VITALS — BP 138/80 | HR 76 | Ht 68.0 in | Wt 167.0 lb

## 2018-08-10 DIAGNOSIS — Z8679 Personal history of other diseases of the circulatory system: Secondary | ICD-10-CM

## 2018-08-10 DIAGNOSIS — I679 Cerebrovascular disease, unspecified: Secondary | ICD-10-CM | POA: Diagnosis not present

## 2018-08-10 DIAGNOSIS — E7800 Pure hypercholesterolemia, unspecified: Secondary | ICD-10-CM

## 2018-08-10 DIAGNOSIS — E876 Hypokalemia: Secondary | ICD-10-CM

## 2018-08-10 DIAGNOSIS — I10 Essential (primary) hypertension: Secondary | ICD-10-CM | POA: Diagnosis not present

## 2018-08-10 DIAGNOSIS — R69 Illness, unspecified: Secondary | ICD-10-CM

## 2018-08-10 MED ORDER — ASPIRIN EC 81 MG PO TBEC: 81.0000 mg | DELAYED_RELEASE_TABLET | Freq: Every day | ORAL | 3 refills | Status: DC

## 2018-08-10 MED ORDER — POTASSIUM CHLORIDE ER 20 MEQ PO TBCR: 1.0000 | EXTENDED_RELEASE_TABLET | Freq: Every day | ORAL | 1 refills | Status: DC

## 2018-08-10 MED ORDER — ATORVASTATIN CALCIUM 10 MG PO TABS: 10.0000 mg | ORAL_TABLET | Freq: Every day | ORAL | 1 refills | Status: DC

## 2018-08-10 MED ORDER — AMLODIPINE BESYLATE 10 MG PO TABS: 10.0000 mg | ORAL_TABLET | Freq: Every day | ORAL | 1 refills | Status: DC

## 2018-08-10 MED ORDER — HYDROCHLOROTHIAZIDE 25 MG PO TABS: 25.0000 mg | ORAL_TABLET | Freq: Every day | ORAL | 1 refills | Status: DC

## 2018-08-10 NOTE — Patient Instructions (Signed)
Mediterranean Diet A Mediterranean diet refers to food and lifestyle choices that are based on the traditions of countries located on the [place]. This way of eating has been shown to help prevent certain conditions and improve outcomes for people who have chronic diseases, like kidney disease and heart disease. What are tips for following this plan? Lifestyle  Cook and eat meals together with your family, when possible.  Drink enough fluid to keep your urine clear or pale yellow.  Be physically active every day. This includes: ? Aerobic exercise like running or swimming. ? Leisure activities like gardening, walking, or housework.  Get 7-8 hours of sleep each night.  If recommended by your health care provider, drink red wine in moderation. This means 1 glass a day for nonpregnant women and 2 glasses a day for men. A glass of wine equals 5 oz (150 mL). Reading food labels   Check the serving size of packaged foods. For foods such as rice and pasta, the serving size refers to the amount of cooked product, not dry.  Check the total fat in packaged foods. Avoid foods that have saturated fat or trans fats.  Check the ingredients list for added sugars, such as corn syrup. Shopping  At the grocery store, buy most of your food from the areas near the walls of the store. This includes: ? Fresh fruits and vegetables (produce). ? Grains, beans, nuts, and seeds. Some of these may be available in unpackaged forms or large amounts (in bulk). ? Fresh seafood. ? Poultry and eggs. ? Low-fat dairy products.  Buy whole ingredients instead of prepackaged foods.  Buy fresh fruits and vegetables in-season from local farmers markets.  Buy frozen fruits and vegetables in resealable bags.  If you do not have access to quality fresh seafood, buy precooked frozen shrimp or canned fish, such as tuna, salmon, or sardines.  Buy small amounts of raw or cooked vegetables, salads, or olives from  the deli or salad bar at your store.  Stock your pantry so you always have certain foods on hand, such as olive oil, canned tuna, canned tomatoes, rice, pasta, and beans. Cooking  Cook foods with extra-virgin olive oil instead of using butter or other vegetable oils.  Have meat as a side dish, and have vegetables or grains as your main dish. This means having meat in small portions or adding small amounts of meat to foods like pasta or stew.  Use beans or vegetables instead of meat in common dishes like chili or lasagna.  Experiment with different cooking methods. Try roasting or broiling vegetables instead of steaming or sauteing them.  Add frozen vegetables to soups, stews, pasta, or rice.  Add nuts or seeds for added healthy fat at each meal. You can add these to yogurt, salads, or vegetable dishes.  Marinate fish or vegetables using olive oil, lemon juice, garlic, and fresh herbs. Meal planning   Plan to eat 1 vegetarian meal one day each week. Try to work up to 2 vegetarian meals, if possible.  Eat seafood 2 or more times a week.  Have healthy snacks readily available, such as: ? Vegetable sticks with hummus. ? Greek yogurt. ? Fruit and nut trail mix.  Eat balanced meals throughout the week. This includes: ? Fruit: 2-3 servings a day ? Vegetables: 4-5 servings a day ? Low-fat dairy: 2 servings a day ? Fish, poultry, or lean meat: 1 serving a day ? Beans and legumes: 2 or more servings a week ?   Nuts and seeds: 1-2 servings a day ? Whole grains: 6-8 servings a day ? Extra-virgin olive oil: 3-4 servings a day  Limit red meat and sweets to only a few servings a month What are my food choices?  Mediterranean diet ? Recommended  Grains: Whole-grain pasta. Brown rice. Bulgar wheat. Polenta. Couscous. Whole-wheat bread. Oatmeal. Quinoa.  Vegetables: Artichokes. Beets. Broccoli. Cabbage. Carrots. Eggplant. Green beans. Chard. Kale. Spinach. Onions. Leeks. Peas. Squash.  Tomatoes. Peppers. Radishes.  Fruits: Apples. Apricots. Avocado. Berries. Bananas. Cherries. Dates. Figs. Grapes. Lemons. Melon. Oranges. Peaches. Plums. Pomegranate.  Meats and other protein foods: Beans. Almonds. Sunflower seeds. Pine nuts. Peanuts. Cod. Salmon. Scallops. Shrimp. Tuna. Tilapia. Clams. Oysters. Eggs.  Dairy: Low-fat milk. Cheese. Greek yogurt.  Beverages: Water. Red wine. Herbal tea.  Fats and oils: Extra virgin olive oil. Avocado oil. Grape seed oil.  Sweets and desserts: Greek yogurt with honey. Baked apples. Poached pears. Trail mix.  Seasoning and other foods: Basil. Cilantro. Coriander. Cumin. Mint. Parsley. Sage. Rosemary. Tarragon. Garlic. Oregano. Thyme. Pepper. Balsalmic vinegar. Tahini. Hummus. Tomato sauce. Olives. Mushrooms. ? Limit these  Grains: Prepackaged pasta or rice dishes. Prepackaged cereal with added sugar.  Vegetables: Deep fried potatoes (french fries).  Fruits: Fruit canned in syrup.  Meats and other protein foods: Beef. Pork. Lamb. Poultry with skin. Hot dogs. Bacon.  Dairy: Ice cream. Sour cream. Whole milk.  Beverages: Juice. Sugar-sweetened soft drinks. Beer. Liquor and spirits.  Fats and oils: Butter. Canola oil. Vegetable oil. Beef fat (tallow). Lard.  Sweets and desserts: Cookies. Cakes. Pies. Candy.  Seasoning and other foods: Mayonnaise. Premade sauces and marinades. The items listed may not be a complete list. Talk with your dietitian about what dietary choices are right for you. Summary  The Mediterranean diet includes both food and lifestyle choices.  Eat a variety of fresh fruits and vegetables, beans, nuts, seeds, and whole grains.  Limit the amount of red meat and sweets that you eat.  Talk with your health care provider about whether it is safe for you to drink red wine in moderation. This means 1 glass a day for nonpregnant women and 2 glasses a day for men. A glass of wine equals 5 oz (150 mL). This information  is not intended to replace advice given to you by your health care provider. Make sure you discuss any questions you have with your health care provider. Document Released: 09/06/2015 Document Revised: 09/13/2015 Document Reviewed: 09/06/2015 Elsevier Patient Education  2020 [employer]  

## 2018-08-10 NOTE — Progress Notes (Addendum)
Date:  08/10/2018   Name:  Christopher Browning   DOB:  30-Mar-1950   MRN:  [id number]   Chief Complaint: Hypertension, Hyperlipidemia, and hypokalemia  Hypertension This is a chronic problem. The current episode started more than 1 year ago. The problem is unchanged. The problem is controlled. Pertinent negatives include no anxiety, blurred vision, chest pain, headaches, malaise/fatigue, neck pain, orthopnea, palpitations, peripheral edema, PND, shortness of breath or sweats. There are no associated agents to hypertension. Risk factors for coronary artery disease include dyslipidemia and male gender. Past treatments include calcium channel blockers and diuretics. The current treatment provides moderate improvement. There are no compliance problems.  Hypertensive end-organ damage includes CVA. There is no history of angina, kidney disease, CAD/MI, heart failure, left ventricular hypertrophy, PVD or retinopathy. There is no history of chronic renal disease, a hypertension causing med or renovascular disease.  Hyperlipidemia This is a chronic problem. The current episode started more than 1 year ago. The problem is controlled. Recent lipid tests were reviewed and are normal. He has no history of chronic renal disease. Factors aggravating his hyperlipidemia include thiazides. Pertinent negatives include no chest pain, focal sensory loss, focal weakness, leg pain, myalgias or shortness of breath. Current antihyperlipidemic treatment includes statins. The current treatment provides moderate improvement of lipids. There are no compliance problems.  Risk factors for coronary artery disease include dyslipidemia.  Neurologic Problem The patient's pertinent negatives include no focal sensory loss or focal weakness. Primary symptoms comment: atherosclerosis /bilat carotid. This is a chronic problem. The current episode started more than 1 year ago. The neurological problem developed gradually. The problem is unchanged.  Pertinent negatives include no abdominal pain, aura, back pain, chest pain, diaphoresis, dizziness, fatigue, fever, headaches, nausea, neck pain, palpitations, shortness of breath or vomiting.    Review of Systems  Constitutional: Negative for chills, diaphoresis, fatigue, fever and malaise/fatigue.  HENT: Negative for drooling, ear discharge, ear pain and sore throat.   Eyes: Negative for blurred vision.  Respiratory: Negative for cough, shortness of breath and wheezing.   Cardiovascular: Negative for chest pain, palpitations, orthopnea, leg swelling and PND.  Gastrointestinal: Negative for abdominal pain, blood in stool, constipation, diarrhea, nausea and vomiting.  Endocrine: Negative for polydipsia.  Genitourinary: Negative for dysuria, frequency, hematuria and urgency.  Musculoskeletal: Negative for back pain, myalgias and neck pain.  Skin: Negative for rash.  Allergic/Immunologic: Negative for environmental allergies.  Neurological: Negative for dizziness, focal weakness and headaches.  Hematological: Does not bruise/bleed easily.  Psychiatric/Behavioral: Negative for suicidal ideas. The patient is not nervous/anxious.     Patient Active Problem List   Diagnosis Date Noted  . Atherosclerosis of both carotid arteries 02/04/2017  . Cerebral vascular disease 02/04/2017  . History of atrial fibrillation 02/04/2017  . 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 Pure hypercholesterolemia 09/11/2016  . Essential hypertension 11/29/2014    No Known Allergies  Past Surgical History:  Procedure Laterality Date  . COLONOSCOPY  2011   cleared for 5 yrs-    Social History   Tobacco Use  . Smoking status: Never Smoker  . Smokeless tobacco: Never Used  Substance Use Topics  . Alcohol use: No    Alcohol/week: 0.0 standard drinks  . Drug use: No     Medication list has been reviewed and updated.  Current Meds  Medication Sig  . amLODipine (NORVASC) 10 MG  tablet Take 1 tablet (10 mg total) by mouth daily.  Marland Kitchen. aspirin EC  81 MG tablet Take 1 tablet (81 mg total) by mouth daily.  Marland Kitchen. atorvastatin (LIPITOR) 10 MG tablet Take 1 tablet (10 mg total) by mouth daily.  . hydrochlorothiazide (HYDRODIURIL) 25 MG tablet Take 1 tablet (25 mg total) by mouth daily.  . Potassium Chloride ER 20 MEQ TBCR Take 1 tablet by mouth daily.    PHQ 2/9 Scores 08/10/2018 11/04/2017 08/04/2017 08/04/2017  PHQ - 2 Score 0 0 0 0  PHQ- 9 Score 0 - 0 -    BP Readings from Last 3 Encounters:  08/10/18 138/80  02/04/18 (!) 142/80  11/04/17 122/82    Physical Exam Vitals signs and nursing note reviewed.  HENT:     Head: Normocephalic.     Right Ear: Tympanic membrane, ear canal and external ear normal.     Left Ear: Tympanic membrane, ear canal and external ear normal.     Nose: Nose normal.  Eyes:     General: No scleral icterus.       Right eye: No discharge.        Left eye: No discharge.     Conjunctiva/sclera: Conjunctivae normal.     Pupils: Pupils are equal, round, and reactive to light.  Neck:     Musculoskeletal: Normal range of motion and neck supple.     Thyroid: No thyromegaly.     Vascular: No JVD.     Trachea: No tracheal deviation.  Cardiovascular:     Rate and Rhythm: Normal rate and regular rhythm.     Chest Wall: PMI is not displaced. No thrill.     Pulses: Normal pulses.          Carotid pulses are 2+ on the right side and 2+ on the left side.      Radial pulses are 2+ on the right side and 2+ on the left side.       Femoral pulses are 2+ on the right side and 2+ on the left side.      Popliteal pulses are 2+ on the right side and 2+ on the left side.       Dorsalis pedis pulses are 2+ on the right side and 2+ on the left side.       Posterior tibial pulses are 2+ on the right side and 2+ on the left side.     Heart sounds: Normal heart sounds, S1 normal and S2 normal. Heart sounds not distant. No murmur. No systolic murmur. No diastolic murmur.  No friction rub. No gallop. No S3 or S4 sounds.   Pulmonary:     Effort: No respiratory distress.     Breath sounds: Normal breath sounds. No wheezing or rales.  Abdominal:     General: Bowel sounds are normal.     Palpations: Abdomen is soft. There is no mass.     Tenderness: There is no abdominal tenderness. There is no guarding or rebound.  Musculoskeletal: Normal range of motion.        General: No tenderness.     Right lower leg: No edema.     Left lower leg: No edema.  Lymphadenopathy:     Cervical: No cervical adenopathy.  Skin:    General: Skin is warm.     Findings: No rash.  Neurological:     Mental Status: He is alert and oriented to person, place, and time.     Cranial Nerves: No cranial nerve deficit.     Deep Tendon Reflexes: Reflexes are normal and  symmetric.     Wt Readings from Last 3 Encounters:  08/10/18 167 lb (75.8 kg)  02/04/18 165 lb (74.8 kg)  11/04/17 167 lb 6.4 oz (75.9 kg)    BP 138/80   Pulse 76   Ht 5\' 8"  (1.727 m)   Wt 167 lb (75.8 kg)   BMI 25.39 kg/m   Assessment and Plan: 1. Cerebral vascular disease Patient with history of TIA evaluated with carotid ultrasound which noted less than 50% buildup of atherosclerosis bilateral.  Currently treating with aspirin 81 mg once a day. - aspirin EC 81 MG tablet; Take 1 tablet (81 mg total) by mouth daily.  Dispense: 100 tablet; Refill: 3  2. History of atrial fibrillation Patient has a history of atrial fibrillation but is in sinus rhythm today we will continue on aspirin 81 mg once a day. - aspirin EC 81 MG tablet; Take 1 tablet (81 mg total) by mouth daily.  Dispense: 100 tablet; Refill: 3  3. Essential hypertension Chronic.  Controlled.  Continue amlodipine 10 mg once a day and hydrochlorothiazide 25 mg once a day will check a renal function panel today. - Renal Function Panel - amLODipine (NORVASC) 10 MG tablet; Take 1 tablet (10 mg total) by mouth daily.  Dispense: 90 tablet; Refill: 1 -  hydrochlorothiazide (HYDRODIURIL) 25 MG tablet; Take 1 tablet (25 mg total) by mouth daily.  Dispense: 90 tablet; Refill: 1  4. Hypokalemia Patient has history of hypokalemia presumably from diuretic use will continue on potassium chloride 20 mEq once a day. - Potassium Chloride ER 20 MEQ TBCR; Take 1 tablet by mouth daily.  Dispense: 90 tablet; Refill: 1  5. Pure hypercholesterolemia Chronic.  Controlled.  Continue atorvastatin 10 mg once a day.  Will check lipid panel. - Lipid Panel With LDL/HDL Ratio - atorvastatin (LIPITOR) 10 MG tablet; Take 1 tablet (10 mg total) by mouth daily.  Dispense: 90 tablet; Refill: 1  6. Taking medication for chronic disease Patient on statin requiring periodic check for hepatotoxicity for which we will do hepatic function panel today. - Hepatic function panel

## 2018-08-11 LAB — RENAL FUNCTION PANEL
Albumin: 5 g/dL — ABNORMAL HIGH (ref 3.8–4.8)
BUN/Creatinine Ratio: 16 (ref 10–24)
BUN: 19 mg/dL (ref 8–27)
CO2: 23 mmol/L (ref 20–29)
Calcium: 10.2 mg/dL (ref 8.6–10.2)
Chloride: 98 mmol/L (ref 96–106)
Creatinine, Ser: 1.17 mg/dL (ref 0.76–1.27)
GFR calc Af Amer: 75 mL/min/{1.73_m2} (ref >59)
GFR calc non Af Amer: 65 mL/min/{1.73_m2} (ref >59)
Glucose: 97 mg/dL (ref 65–99)
Phosphorus: 2.8 mg/dL (ref 2.8–4.1)
Potassium: 3.3 mmol/L — ABNORMAL LOW (ref 3.5–5.2)
Sodium: 139 mmol/L (ref 134–144)

## 2018-08-11 LAB — HEPATIC FUNCTION PANEL
ALT: 41 IU/L (ref 0–44)
AST: 34 IU/L (ref 0–40)
Alkaline Phosphatase: 51 IU/L (ref 39–117)
Bilirubin Total: 0.7 mg/dL (ref 0.0–1.2)
Bilirubin, Direct: 0.18 mg/dL (ref 0.00–0.40)
Total Protein: 7.6 g/dL (ref 6.0–8.5)

## 2018-08-11 LAB — LIPID PANEL WITH LDL/HDL RATIO
Cholesterol, Total: 126 mg/dL (ref 100–199)
HDL: 48 mg/dL (ref >39)
LDL Calculated: 67 mg/dL (ref 0–99)
LDl/HDL Ratio: 1.4 ratio (ref 0.0–3.6)
Triglycerides: 53 mg/dL (ref 0–149)
VLDL Cholesterol Cal: 11 mg/dL (ref 5–40)

## 2018-09-06 IMAGING — MR MR HEAD W/O CM
10 series · 43 of 48 positions shown · non-contrast
Comparison: None.

CLINICAL DATA: Left-sided whole body numbness. Intermittent but
recurrent.

EXAM:
MRI HEAD WITHOUT CONTRAST
TECHNIQUE: Multiplanar, multiecho pulse sequences of the brain and surrounding
structures were obtained without intravenous contrast.

[Series 2: T1 · sagittal · 5.0mm · 0.47mm/px · 2 of 23 slices shown (1 of 2)]
[im 1/23]
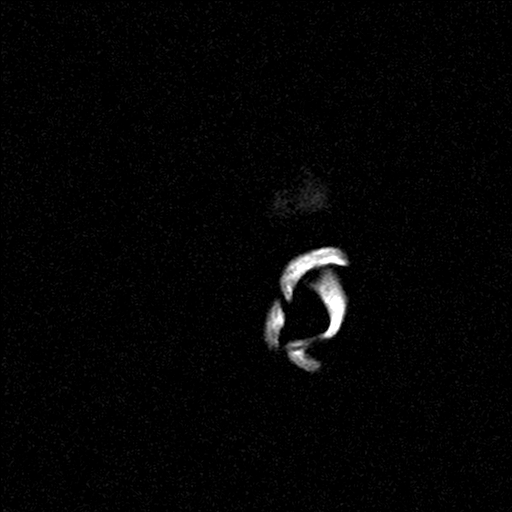
[im 23/23]
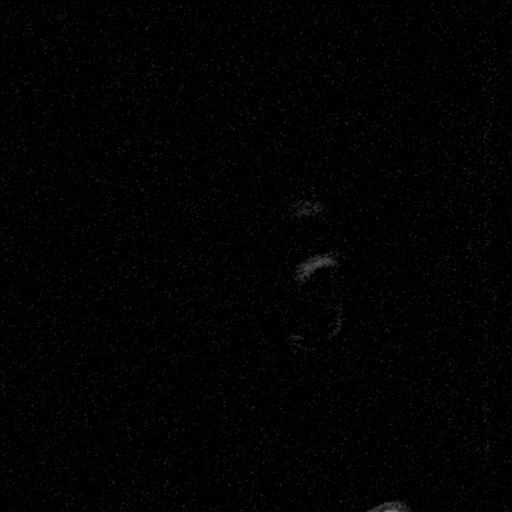

[Series 4: DWI · axial · 3.0mm · 0.94mm/px · z∈[-42,+105]mm · 4 of 50 slices shown (1 of 4)]
[im 1/50]
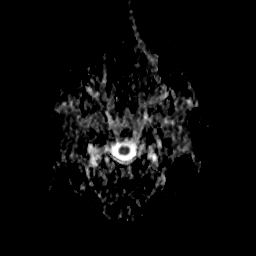
[im 17/50]
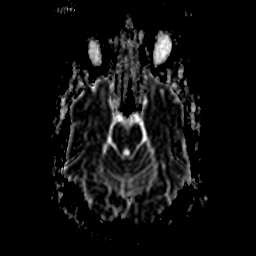
[im 33/50]
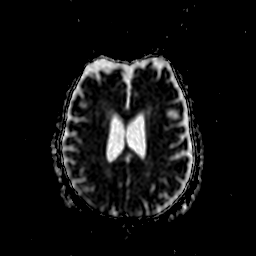
[im 50/50]
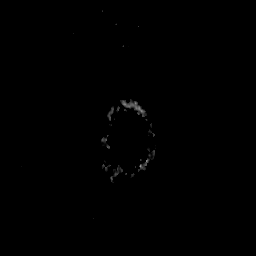

[Series 5: DWI · coronal · 5.0mm · 1.80mm/px · 10 of 110 slices shown (2 of 4)]
[im 1/110]
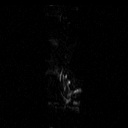
[im 13/110]
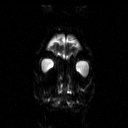
[im 25/110]
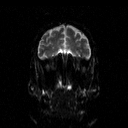
[im 37/110]
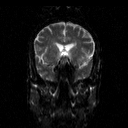
[im 49/110]
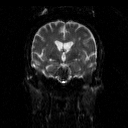
[im 61/110]
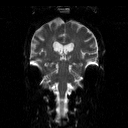
[im 73/110]
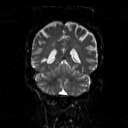
[im 85/110]
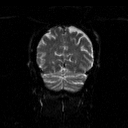
[im 97/110]
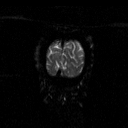
[im 110/110]
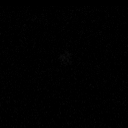

[Series 7: DWI · axial · 3.0mm · 0.94mm/px · z∈[-42,+99]mm · 4 of 48 slices shown (3 of 4)]
[im 1/48]
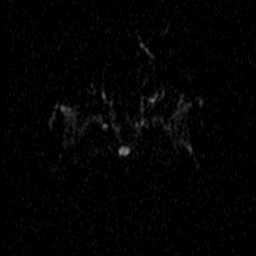
[im 16/48]
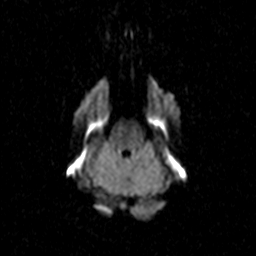
[im 32/48]
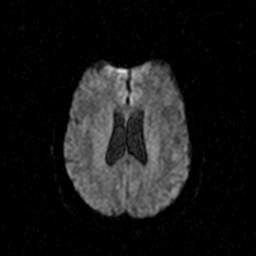
[im 48/48]
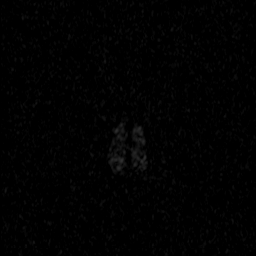

[Series 8: DWI · coronal · 5.0mm · 1.80mm/px · 3 of 34 slices shown (4 of 4)]
[im 1/34]
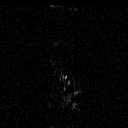
[im 17/34]
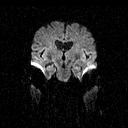
[im 34/34]
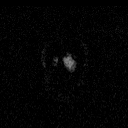

[Series 9: T2 · axial · 5.0mm · 0.45mm/px · z∈[-46,+108]mm · 2 of 23 slices shown (1 of 3)]
[im 1/23]
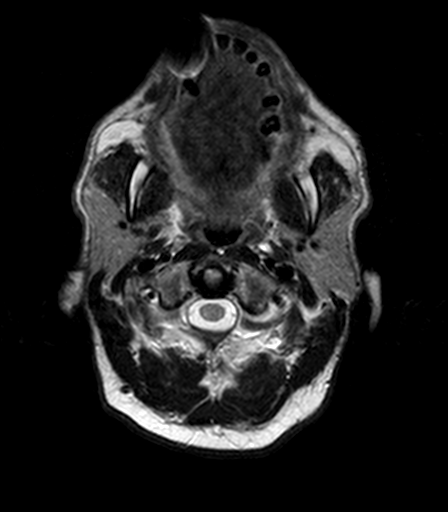
[im 23/23]
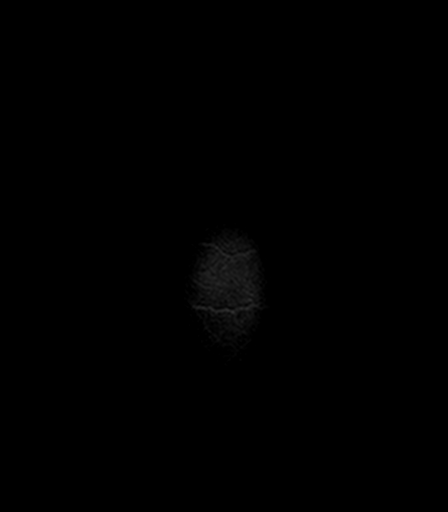

[Series 10: FLAIR · axial · 3.0mm · 0.90mm/px · z∈[-45,+108]mm · 5 of 52 slices shown]
[im 1/52]
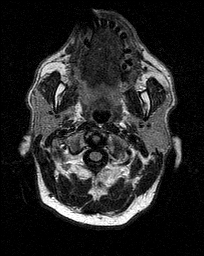
[im 13/52]
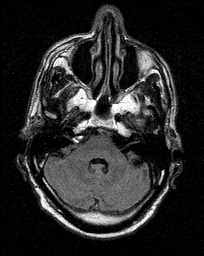
[im 26/52]
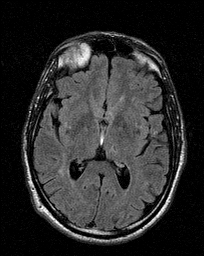
[im 39/52]
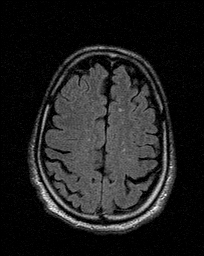
[im 52/52]
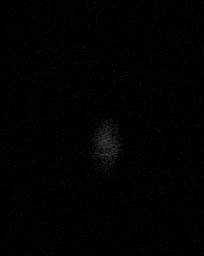

[Series 11: T2 · axial · 5.0mm · 0.45mm/px · z∈[-46,+108]mm · 2 of 23 slices shown (2 of 3)]
[im 1/23]
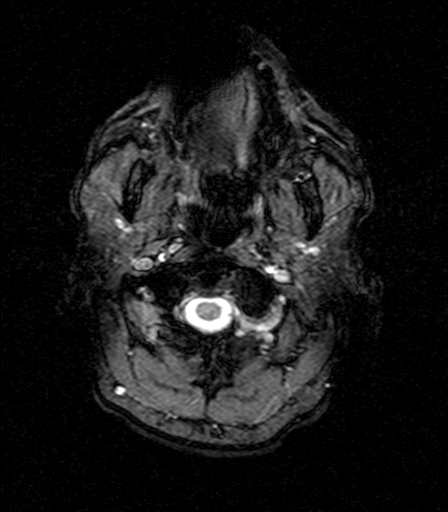
[im 23/23]
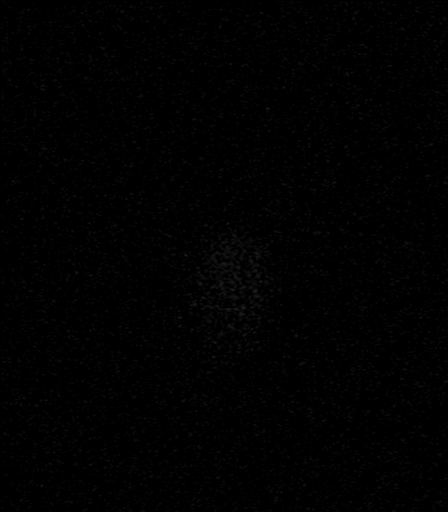

[Series 12: T1 · axial · 1.0mm · 0.45mm/px · z∈[-48,+111]mm · 9 of 160 slices shown (2 of 2)]
[im 1/160]
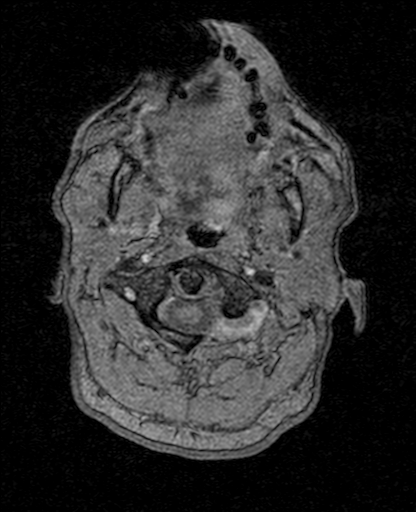
[im 13/160]
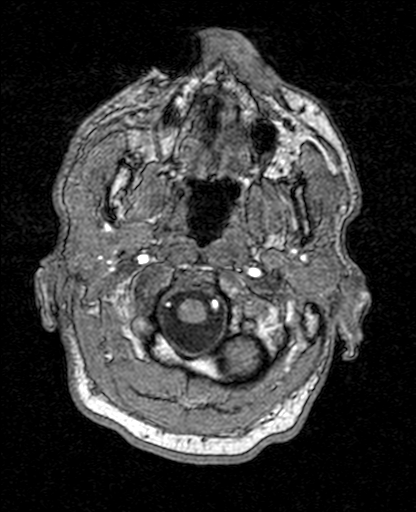
[im 25/160]
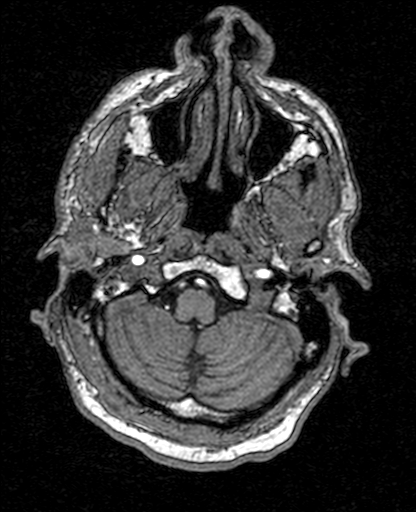
[im 49/160]
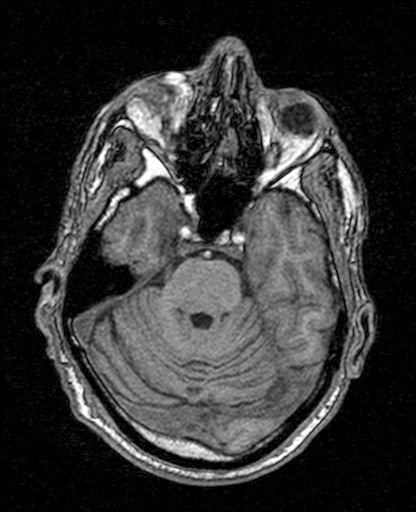
[im 74/160]
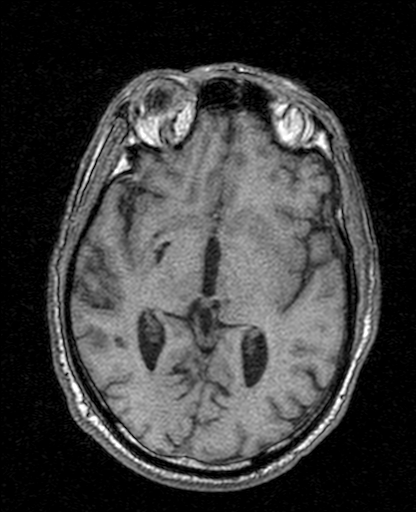
[im 86/160]
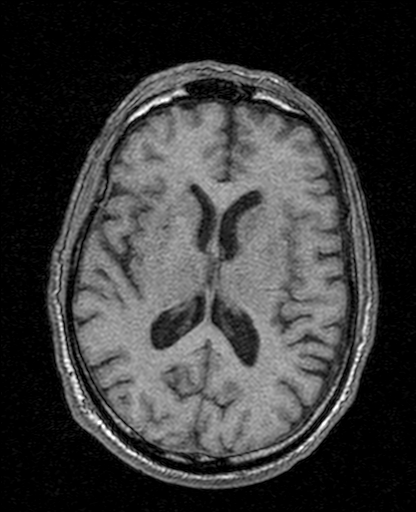
[im 111/160]
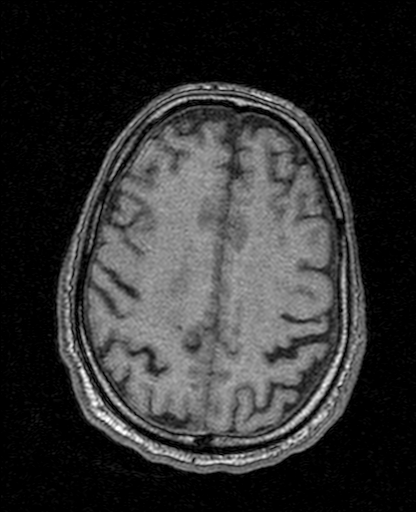
[im 135/160]
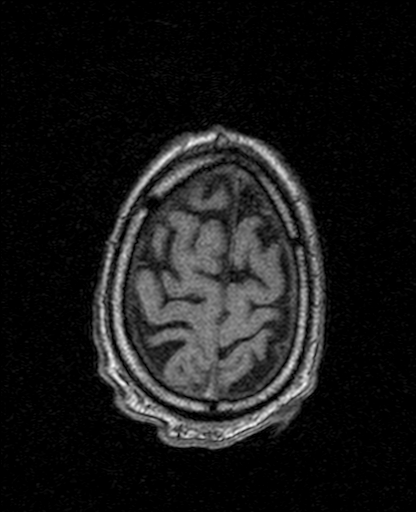
[im 160/160]
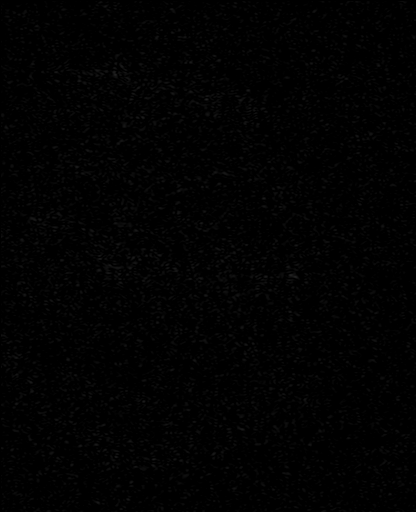

[Series 13: T2 · coronal · 5.0mm · 0.45mm/px · 2 of 27 slices shown (3 of 3)]
[im 1/27]
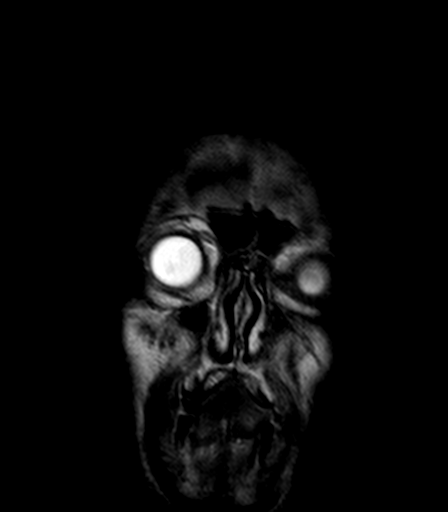
[im 27/27]
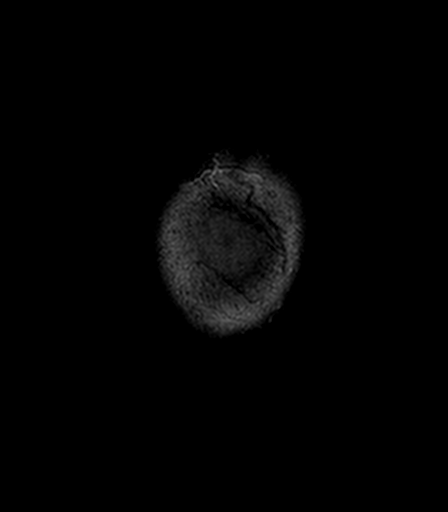

[43 of 48 positions shown; findings below may reference images not displayed]

FINDINGS: Brain: Diffusion imaging does not show any acute or subacute
infarction. The brainstem and cerebellum are normal. There is an old
lacunar infarction in the left thalamus. Cerebral hemispheres show
scattered foci of T2 and FLAIR signal within the deep and
subcortical white matter, moderate in degree. No cortical or large
vessel territory infarction. The differential diagnosis includes
both small-vessel ischemic changes and demyelinating disease. No
evidence of mass lesion, hemorrhage, hydrocephalus or extra-axial
collection.

Vascular: Major vessels at the base of the brain show flow.

Skull and upper cervical spine: Negative

Sinuses/Orbits: Clear/normal

Other: None
IMPRESSION: No acute finding.

Moderate severity white matter lesions scattered throughout the
cerebral hemispheric deep and subcortical white matter. Old lacunar
infarction left thalamus. Most likely diagnosis is that of chronic
small vessel ischemic changes. One could not exclude the possibility
of demyelinating disease based on this exam.

## 2019-01-31 ENCOUNTER — Encounter: Payer: Self-pay | Admitting: Family Medicine

## 2019-01-31 ENCOUNTER — Other Ambulatory Visit: Payer: Self-pay

## 2019-01-31 ENCOUNTER — Ambulatory Visit: Payer: Medicare PPO | Admitting: Family Medicine

## 2019-01-31 VITALS — BP 138/88 | HR 84 | Ht 68.0 in | Wt 167.0 lb

## 2019-01-31 DIAGNOSIS — E876 Hypokalemia: Secondary | ICD-10-CM | POA: Diagnosis not present

## 2019-01-31 DIAGNOSIS — I10 Essential (primary) hypertension: Secondary | ICD-10-CM

## 2019-01-31 DIAGNOSIS — Z23 Encounter for immunization: Secondary | ICD-10-CM

## 2019-01-31 DIAGNOSIS — L723 Sebaceous cyst: Secondary | ICD-10-CM

## 2019-01-31 DIAGNOSIS — Z8679 Personal history of other diseases of the circulatory system: Secondary | ICD-10-CM | POA: Diagnosis not present

## 2019-01-31 DIAGNOSIS — I679 Cerebrovascular disease, unspecified: Secondary | ICD-10-CM

## 2019-01-31 DIAGNOSIS — E7800 Pure hypercholesterolemia, unspecified: Secondary | ICD-10-CM | POA: Diagnosis not present

## 2019-01-31 MED ORDER — ATORVASTATIN CALCIUM 10 MG PO TABS: 10.0000 mg | ORAL_TABLET | Freq: Every day | ORAL | 1 refills | Status: DC

## 2019-01-31 MED ORDER — POTASSIUM CHLORIDE ER 20 MEQ PO TBCR: 1.0000 | EXTENDED_RELEASE_TABLET | Freq: Every day | ORAL | 1 refills | Status: DC

## 2019-01-31 MED ORDER — ASPIRIN EC 81 MG PO TBEC: 81.0000 mg | DELAYED_RELEASE_TABLET | Freq: Every day | ORAL | 3 refills | Status: AC

## 2019-01-31 MED ORDER — AMLODIPINE BESYLATE 10 MG PO TABS: 10.0000 mg | ORAL_TABLET | Freq: Every day | ORAL | 1 refills | Status: DC

## 2019-01-31 MED ORDER — HYDROCHLOROTHIAZIDE 25 MG PO TABS: 25.0000 mg | ORAL_TABLET | Freq: Every day | ORAL | 1 refills | Status: DC

## 2019-01-31 NOTE — Progress Notes (Signed)
Date:  01/31/2019   Name:  Christopher Browning   DOB:  02-May-1950   MRN:  [id number]   Chief Complaint: Hypertension, Hyperlipidemia, hypokalemia, and pneum 23  Patient followed for . Presently on potassium supplementation/last potassium level 3.3. currently taking 20 MEQ KCL.  Hypertension This is a chronic problem. The current episode started more than 1 year ago. The problem has been waxing and waning since onset. The problem is controlled. Pertinent negatives include no anxiety, blurred vision, chest pain, headaches, malaise/fatigue, neck pain, orthopnea, palpitations, peripheral edema, PND, shortness of breath or sweats. There are no associated agents to hypertension. There are no known risk factors for coronary artery disease. Past treatments include diuretics and calcium channel blockers. The current treatment provides moderate improvement. There are no compliance problems.  There is no history of angina, kidney disease, CAD/MI, CVA, heart failure, left ventricular hypertrophy, PVD or retinopathy. There is no history of chronic renal disease, a hypertension causing med or renovascular disease.  Hyperlipidemia This is a chronic problem. The current episode started more than 1 year ago. The problem is controlled. Recent lipid tests were reviewed and are normal. He has no history of chronic renal disease. Pertinent negatives include no chest pain, focal sensory loss, focal weakness, leg pain, myalgias or shortness of breath. Current antihyperlipidemic treatment includes statins. The current treatment provides moderate improvement of lipids. There are no compliance problems.  Risk factors for coronary artery disease include dyslipidemia and hypertension.  Neurologic Problem The patient's pertinent negatives include no altered mental status, clumsiness, focal sensory loss, focal weakness, loss of balance, memory loss, near-syncope, slurred speech, syncope, visual change or weakness. Primary symptoms  comment: for cerebro vascular. This is a chronic problem. The current episode started more than 1 year ago. The neurological problem developed insidiously. The problem has been waxing and waning since onset. Pertinent negatives include no abdominal pain, back pain, bladder incontinence, bowel incontinence, chest pain, diaphoresis, dizziness, fatigue, fever, headaches, nausea, neck pain, palpitations, shortness of breath, vertigo or vomiting. The treatment provided moderate relief.    Lab Results  Component Value Date   CREATININE 1.17 08/10/2018   BUN 19 08/10/2018   NA 139 08/10/2018   K 3.3 (L) 08/10/2018   CL 98 08/10/2018   CO2 23 08/10/2018   Lab Results  Component Value Date   CHOL 126 08/10/2018   HDL 48 08/10/2018   LDLCALC 67 08/10/2018   TRIG 53 08/10/2018   CHOLHDL 2.8 08/04/2017   No results found for: TSH No results found for: HGBA1C   Review of Systems  Constitutional: Negative for chills, diaphoresis, fatigue, fever and malaise/fatigue.  HENT: Negative for drooling, ear discharge, ear pain, postnasal drip and sore throat.   Eyes: Negative for blurred vision.  Respiratory: Negative for cough, shortness of breath and wheezing.   Cardiovascular: Negative for chest pain, palpitations, orthopnea, leg swelling, PND and near-syncope.  Gastrointestinal: Negative for abdominal pain, blood in stool, bowel incontinence, constipation, diarrhea, nausea and vomiting.  Endocrine: Negative for polydipsia.  Genitourinary: Negative for bladder incontinence, dysuria, frequency, hematuria and urgency.  Musculoskeletal: Negative for back pain, myalgias and neck pain.  Skin: Negative for rash.  Allergic/Immunologic: Negative for environmental allergies.  Neurological: Negative for dizziness, vertigo, focal weakness, syncope, facial asymmetry, weakness, numbness, headaches and loss of balance.  Hematological: Does not bruise/bleed easily.  Psychiatric/Behavioral: Negative for memory  loss and suicidal ideas. The patient is not nervous/anxious.     Patient Active Problem List  Diagnosis Date Noted  . Atherosclerosis of both carotid arteries 02/04/2017  . Cerebral vascular disease 02/04/2017  . History of atrial fibrillation 02/04/2017  . 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 Pure hypercholesterolemia 09/11/2016  . Essential hypertension 11/29/2014    No Known Allergies  Past Surgical History:  Procedure Laterality Date  . COLONOSCOPY  2011   cleared for 5 yrs-    Social History   Tobacco Use  . Smoking status: Never Smoker  . Smokeless tobacco: Never Used  Substance Use Topics  . Alcohol use: No    Alcohol/week: 0.0 standard drinks  . Drug use: No     Medication list has been reviewed and updated.  Current Meds  Medication Sig  . amLODipine (NORVASC) 10 MG tablet Take 1 tablet (10 mg total) by mouth daily.  Marland Kitchen aspirin EC 81 MG tablet Take 1 tablet (81 mg total) by mouth daily.  Marland Kitchen atorvastatin (LIPITOR) 10 MG tablet Take 1 tablet (10 mg total) by mouth daily.  . hydrochlorothiazide (HYDRODIURIL) 25 MG tablet Take 1 tablet (25 mg total) by mouth daily.  . Potassium Chloride ER 20 MEQ TBCR Take 1 tablet by mouth daily.    PHQ 2/9 Scores 01/31/2019 08/10/2018 11/04/2017 08/04/2017  PHQ - 2 Score 0 0 0 0  PHQ- 9 Score 0 0 - 0    BP Readings from Last 3 Encounters:  01/31/19 138/88  08/10/18 138/80  02/04/18 (!) 142/80    Physical Exam Vitals and nursing note reviewed.  HENT:     Head: Normocephalic.     Right Ear: External ear normal.     Left Ear: External ear normal.     Nose: Nose normal.  Eyes:     General: No scleral icterus.       Right eye: No discharge.        Left eye: No discharge.     Conjunctiva/sclera: Conjunctivae normal.     Pupils: Pupils are equal, round, and reactive to light.  Neck:     Thyroid: No thyromegaly.     Vascular: No JVD.     Trachea: No tracheal deviation.  Cardiovascular:     Rate and  Rhythm: Normal rate and regular rhythm.     Heart sounds: Normal heart sounds, S1 normal and S2 normal. No murmur. No systolic murmur. No diastolic murmur. No friction rub. No gallop. No S3 or S4 sounds.   Pulmonary:     Effort: No respiratory distress.     Breath sounds: Normal breath sounds. No wheezing or rales.  Abdominal:     General: Bowel sounds are normal.     Palpations: Abdomen is soft. There is no mass.     Tenderness: There is no abdominal tenderness. There is no guarding or rebound.  Musculoskeletal:        General: No tenderness. Normal range of motion.     Cervical back: Normal range of motion and neck supple.  Lymphadenopathy:     Cervical: No cervical adenopathy.  Skin:    General: Skin is warm.     Findings: No rash.     Comments: seb cyst left anterior neck  Neurological:     Mental Status: He is alert and oriented to person, place, and time.     Cranial Nerves: No cranial nerve deficit.     Deep Tendon Reflexes: Reflexes are normal and symmetric.     Wt Readings from Last 3 Encounters:  01/31/19 167 lb (75.8 kg)  08/10/18 167 lb (75.8 kg)  02/04/18 165 lb (74.8 kg)    BP 138/88   Pulse 84   Ht 5\' 8"  (1.727 m)   Wt 167 lb (75.8 kg)   BMI 25.39 kg/m   Assessment and Plan: 1. Cerebral vascular disease Chronic.  Controlled.  Stable.  Reviewed 2018 MRI which noted that there were some white matter areas suggesting ischemic changes.  I have encouraged patient to continue 81 mg aspirin on a daily basis. - aspirin EC 81 MG tablet; Take 1 tablet (81 mg total) by mouth daily.  Dispense: 100 tablet; Refill: 3  2. History of atrial fibrillation Chronic.  Currently controlled patient is in sinus rhythm.  Patient is on aspirin and this will afford some protection. - aspirin EC 81 MG tablet; Take 1 tablet (81 mg total) by mouth daily.  Dispense: 100 tablet; Refill: 3  3. Essential hypertension Chronic.  Controlled.  Stable.  Continue amlodipine 10 mg once a day  and hydrochlorothiazide 25 mg once a day.  Will obtain a renal function panel. - Renal Function Panel - amLODipine (NORVASC) 10 MG tablet; Take 1 tablet (10 mg total) by mouth daily.  Dispense: 90 tablet; Refill: 1 - hydrochlorothiazide (HYDRODIURIL) 25 MG tablet; Take 1 tablet (25 mg total) by mouth daily.  Dispense: 90 tablet; Refill: 1  4. Hypokalemia Chronic.  Controlled.  Continue KCl ER 20 mEq once a day.  Will check renal function panel for hypokalemia concerns. - Renal Function Panel - Potassium Chloride ER 20 MEQ TBCR; Take 1 tablet by mouth daily.  Dispense: 90 tablet; Refill: 1  5. Pure hypercholesterolemia Chronic.  Controlled.  Stable.  Continue atorvastatin 10 mg once a day. - atorvastatin (LIPITOR) 10 MG tablet; Take 1 tablet (10 mg total) by mouth daily.  Dispense: 90 tablet; Refill: 1  6. Sebaceous cyst Patient has a very small sebaceous cyst on his neck that he wants me to give him something to dry it up which ever told him that this is a matter of either having it excised or controlling it so does not get too large.  We will refer to dermatology for evaluation and likely excision. - Ambulatory referral to Dermatology  7. Need for pneumococcal vaccine Discussed and administered. - Pneumococcal polysaccharide vaccine 23-valent greater than or equal to 2yo subcutaneous/IM

## 2019-02-01 LAB — RENAL FUNCTION PANEL
Albumin: 4.8 g/dL (ref 3.8–4.8)
BUN/Creatinine Ratio: 12 (ref 10–24)
BUN: 14 mg/dL (ref 8–27)
CO2: 26 mmol/L (ref 20–29)
Calcium: 10.2 mg/dL (ref 8.6–10.2)
Chloride: 97 mmol/L (ref 96–106)
Creatinine, Ser: 1.15 mg/dL (ref 0.76–1.27)
GFR calc Af Amer: 76 mL/min/{1.73_m2} (ref >59)
GFR calc non Af Amer: 66 mL/min/{1.73_m2} (ref >59)
Glucose: 113 mg/dL — ABNORMAL HIGH (ref 65–99)
Phosphorus: 2.7 mg/dL — ABNORMAL LOW (ref 2.8–4.1)
Potassium: 3.3 mmol/L — ABNORMAL LOW (ref 3.5–5.2)
Sodium: 138 mmol/L (ref 134–144)

## 2019-02-07 DIAGNOSIS — L0211 Cutaneous abscess of neck: Secondary | ICD-10-CM | POA: Diagnosis not present

## 2019-03-23 ENCOUNTER — Encounter: Payer: Self-pay | Admitting: Family Medicine

## 2019-03-23 ENCOUNTER — Ambulatory Visit: Payer: Medicare PPO | Admitting: Family Medicine

## 2019-03-23 ENCOUNTER — Other Ambulatory Visit: Payer: Self-pay

## 2019-03-23 VITALS — BP 138/80 | HR 100 | Ht 68.0 in | Wt 169.0 lb

## 2019-03-23 DIAGNOSIS — M7631 Iliotibial band syndrome, right leg: Secondary | ICD-10-CM | POA: Diagnosis not present

## 2019-03-23 DIAGNOSIS — M7061 Trochanteric bursitis, right hip: Secondary | ICD-10-CM

## 2019-03-23 MED ORDER — MELOXICAM 15 MG PO TABS: 15.0000 mg | ORAL_TABLET | Freq: Every day | ORAL | 0 refills | Status: DC

## 2019-03-23 NOTE — Patient Instructions (Signed)
Iliotibial Band Syndrome  Iliotibial band syndrome (ITBS) is a condition that often causes knee pain. It can also cause pain in the outside of your hip, thigh, and knee. The iliotibial band is a strip of tissue that runs from the outside of your hip and down your thigh to the outside of your knee. Repeatedly bending and straightening your knee can irritate the iliotibial band. What are the causes? This condition is caused by inflammation and irritation from the friction of the iliotibial band moving over the thigh bone (femur) when you repeatedly bend and straighten your knee. What increases the risk? This condition is more likely to develop in people who:  Frequently change elevation during their workouts.  Run very long distances.  Recently increased the length or intensity of their workouts.  Run downhill often, or just started running downhill.  Ride a bike very far or often. You may also be at greater risk if you start a new workout routine without first warming up or if you have a job that requires you to bend, squat, or climb frequently. What are the signs or symptoms? Symptoms of this condition include:  Pain along the outside of your knee that may be worse with activity, especially running or going up and down stairs.  A "snapping" sensation over your knee.  Swelling on the outside of your knee.  Pain or a feeling of tightness in your hip. How is this diagnosed? This condition is diagnosed based on your symptoms, medical history, and physical exam. You may also see a health care provider who specializes in reducing pain and increasing mobility (physical therapist). A physical therapist may do an exam to check your balance, movement, and way of walking or running (gait) to see whether the way you move could contribute to your injury. You may also have tests to measure your strength, flexibility, and range of motion. How is this treated? Treatment for this condition  includes:  Resting and limiting exercise.  Returning to activities gradually.  Doing range-of-motion and strengthening exercises (physical therapy) as told by your health care provider.  Including low-impact activities, such as swimming, in your exercise routine. Follow these instructions at home:  If directed, apply ice to the injured area. ? Put ice in a plastic bag. ? Place a towel between your skin and the bag. ? Leave the ice on for 20 minutes, 2-3 times per day.  Return to your normal activities as told by your health care provider. Ask your health care provider what activities are safe for you.  Keep all follow-up visits with your health care provider. This is important. Contact a health care provider if:  Your pain does not improve or gets worse despite treatment. This information is not intended to replace advice given to you by your health care provider. Make sure you discuss any questions you have with your health care provider. Document Revised: 12/26/2016 Document Reviewed: 02/15/2016 Elsevier Patient Education  2020 [employer]  

## 2019-03-23 NOTE — Progress Notes (Signed)
Date:  03/23/2019   Name:  Christopher Browning   DOB:  29-Apr-1950   MRN:  [id number]   Chief Complaint: Hip Pain (R) hip pain x 1 week- doesn't hurt in the am, but by the afternoon it hurts. Advil helps the pain. Gets worse after sitting. )  Hip Pain  The incident occurred 5 to 7 days ago. There was no injury mechanism. The pain is present in the right hip and right thigh. The quality of the pain is described as aching. The pain is at a severity of 4/10. The pain is moderate. The pain has been fluctuating since onset. Pertinent negatives include no inability to bear weight, loss of motion, loss of sensation, muscle weakness, numbness or tingling. The symptoms are aggravated by movement. He has tried NSAIDs for the symptoms. The treatment provided moderate relief.    Lab Results  Component Value Date   CREATININE 1.15 01/31/2019   BUN 14 01/31/2019   NA 138 01/31/2019   K 3.3 (L) 01/31/2019   CL 97 01/31/2019   CO2 26 01/31/2019   Lab Results  Component Value Date   CHOL 126 08/10/2018   HDL 48 08/10/2018   LDLCALC 67 08/10/2018   TRIG 53 08/10/2018   CHOLHDL 2.8 08/04/2017   No results found for: TSH No results found for: HGBA1C   Review of Systems  Constitutional: Negative for chills and fever.  HENT: Negative for drooling, ear discharge, ear pain and sore throat.   Respiratory: Negative for cough, shortness of breath and wheezing.   Cardiovascular: Negative for chest pain, palpitations and leg swelling.  Gastrointestinal: Negative for abdominal pain, blood in stool, constipation, diarrhea and nausea.  Endocrine: Negative for polydipsia.  Genitourinary: Negative for dysuria, frequency, hematuria and urgency.  Musculoskeletal: Negative for arthralgias, back pain, joint swelling, myalgias and neck pain.  Skin: Negative for rash.  Allergic/Immunologic: Negative for environmental allergies.  Neurological: Negative for dizziness, tingling, weakness, numbness and headaches.   Hematological: Does not bruise/bleed easily.  Psychiatric/Behavioral: Negative for suicidal ideas. The patient is not nervous/anxious.     Patient Active Problem List   Diagnosis Date Noted  . Atherosclerosis of both carotid arteries 02/04/2017  . Cerebral vascular disease 02/04/2017  . History of atrial fibrillation 02/04/2017  . 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 Pure hypercholesterolemia 09/11/2016  . Essential hypertension 11/29/2014    No Known Allergies  Past Surgical History:  Procedure Laterality Date  . COLONOSCOPY  2011   cleared for 5 yrs-    Social History   Tobacco Use  . Smoking status: Never Smoker  . Smokeless tobacco: Never Used  Substance Use Topics  . Alcohol use: No    Alcohol/week: 0.0 standard drinks  . Drug use: No     Medication list has been reviewed and updated.  Current Meds  Medication Sig  . amLODipine (NORVASC) 10 MG tablet Take 1 tablet (10 mg total) by mouth daily.  Marland Kitchen aspirin EC 81 MG tablet Take 1 tablet (81 mg total) by mouth daily.  Marland Kitchen atorvastatin (LIPITOR) 10 MG tablet Take 1 tablet (10 mg total) by mouth daily.  . hydrochlorothiazide (HYDRODIURIL) 25 MG tablet Take 1 tablet (25 mg total) by mouth daily.  . Potassium Chloride ER 20 MEQ TBCR Take 1 tablet by mouth daily.    PHQ 2/9 Scores 03/23/2019 01/31/2019 08/10/2018 11/04/2017  PHQ - 2 Score 0 0 0 0  PHQ- 9 Score 0 0 0 -  BP Readings from Last 3 Encounters:  03/23/19 138/80  01/31/19 138/88  08/10/18 138/80    Physical Exam Vitals and nursing note reviewed.  HENT:     Head: Normocephalic.     Right Ear: Tympanic membrane, ear canal and external ear normal.     Left Ear: Tympanic membrane, ear canal and external ear normal.     Nose: Nose normal. No congestion or rhinorrhea.     Mouth/Throat:     Mouth: Mucous membranes are moist.  Eyes:     General: No scleral icterus.       Right eye: No discharge.        Left eye: No discharge.      Conjunctiva/sclera: Conjunctivae normal.     Pupils: Pupils are equal, round, and reactive to light.  Neck:     Thyroid: No thyromegaly.     Vascular: No JVD.     Trachea: No tracheal deviation.  Cardiovascular:     Rate and Rhythm: Normal rate and regular rhythm.     Heart sounds: Normal heart sounds, S1 normal and S2 normal. No murmur. No systolic murmur. No diastolic murmur. No friction rub. No gallop. No S3 or S4 sounds.   Pulmonary:     Effort: No respiratory distress.     Breath sounds: Normal breath sounds. No wheezing, rhonchi or rales.  Abdominal:     General: Bowel sounds are normal.     Palpations: Abdomen is soft. There is no mass.     Tenderness: There is no abdominal tenderness. There is no guarding or rebound.  Musculoskeletal:        General: No tenderness. Normal range of motion.     Cervical back: Normal range of motion and neck supple.     Right hip: Normal. No tenderness or bony tenderness. Normal strength.       Legs:     Comments: Tender hamstring origin along ischium  Lymphadenopathy:     Cervical: No cervical adenopathy.  Skin:    General: Skin is warm.     Findings: No rash.  Neurological:     Mental Status: He is alert and oriented to person, place, and time.     Cranial Nerves: No cranial nerve deficit.     Deep Tendon Reflexes: Reflexes are normal and symmetric.     Wt Readings from Last 3 Encounters:  03/23/19 169 lb (76.7 kg)  01/31/19 167 lb (75.8 kg)  08/10/18 167 lb (75.8 kg)    BP 138/80   Pulse 100   Ht 5\' 8"  (1.727 m)   Wt 169 lb (76.7 kg)   BMI 25.70 kg/m   Assessment and Plan: 1. Trochanteric bursitis of right hip New onset.  Episodic.  Stable.  Patient has off-and-on exacerbation over the past several days right lateral leg pain in the thigh area consistent with either trochanteric bursitis or iliotibial tendinitis.  Patient has been taking Aleve but we will switch to meloxicam 15 mg once a day with as needed acetaminophen. -  meloxicam (MOBIC) 15 MG tablet; Take 1 tablet (15 mg total) by mouth daily.  Dispense: 30 tablet; Refill: 0  2. Iliotibial band tendinitis of right side This may also be involved in the pain in the hip area.  This should also be alleviated with meloxicam 15 mg once a day as well. Patient will call if pain continues after the weekend and is not resolving and her next step will be referral to orthopedics.

## 2019-04-14 ENCOUNTER — Telehealth: Payer: Self-pay

## 2019-04-14 DIAGNOSIS — M5416 Radiculopathy, lumbar region: Secondary | ICD-10-CM | POA: Diagnosis not present

## 2019-04-14 DIAGNOSIS — M25551 Pain in right hip: Secondary | ICD-10-CM | POA: Diagnosis not present

## 2019-04-14 NOTE — Telephone Encounter (Signed)
Pt called in stating his leg/hip from 2/24 is not any better. When trying to walk, it hurts really bad- I sched him for ortho [facility], today @ 2:45 in [place] to be seen at [facility] for further eval

## 2019-04-18 ENCOUNTER — Ambulatory Visit: Payer: Medicare PPO | Admitting: Family Medicine

## 2019-04-18 ENCOUNTER — Other Ambulatory Visit: Payer: Self-pay

## 2019-04-18 ENCOUNTER — Encounter: Payer: Self-pay | Admitting: Family Medicine

## 2019-04-18 VITALS — BP 150/100 | HR 124 | Ht 68.0 in | Wt 167.0 lb

## 2019-04-18 DIAGNOSIS — R000 Tachycardia, unspecified: Secondary | ICD-10-CM

## 2019-04-18 DIAGNOSIS — I10 Essential (primary) hypertension: Secondary | ICD-10-CM

## 2019-04-18 DIAGNOSIS — M5416 Radiculopathy, lumbar region: Secondary | ICD-10-CM

## 2019-04-18 DIAGNOSIS — I700 Atherosclerosis of aorta: Secondary | ICD-10-CM

## 2019-04-18 MED ORDER — METOPROLOL TARTRATE 50 MG PO TABS: 50.0000 mg | ORAL_TABLET | Freq: Two times a day (BID) | ORAL | 0 refills | Status: DC

## 2019-04-18 MED ORDER — PREDNISOLONE 5 MG PO TABS: 5.0000 mg | ORAL_TABLET | Freq: Every day | ORAL | 0 refills | Status: DC

## 2019-04-18 NOTE — Patient Instructions (Signed)

## 2019-04-18 NOTE — Progress Notes (Signed)
Date:  04/18/2019   Name:  Christopher Browning   DOB:  10/22/1950   MRN:  [id number]   Chief Complaint: Hypertension (follow up was given pred. by ortho and told to hold due to BP being high )  Hypertension This is a chronic problem. The current episode started more than 1 year ago. The problem has been waxing and waning since onset. The problem is controlled. Pertinent negatives include no anxiety, blurred vision, chest pain, headaches, malaise/fatigue, neck pain, orthopnea, palpitations, peripheral edema, PND, shortness of breath or sweats. There are no associated agents to hypertension. There are no known risk factors for coronary artery disease. Past treatments include calcium channel blockers and diuretics. The current treatment provides moderate improvement. There is no history of angina, kidney disease, CAD/MI, CVA, heart failure, left ventricular hypertrophy, PVD or retinopathy. There is no history of chronic renal disease, a hypertension causing med or renovascular disease.  Palpitations  This is a new (tachcardia) problem. The current episode started in the past 7 days. The problem has been waxing and waning. Pertinent negatives include no anxiety, chest pain, coughing, dizziness, fever, malaise/fatigue, nausea, numbness or shortness of breath.  Hip Pain  The incident occurred more than 1 week ago. There was no injury mechanism. The pain is present in the right hip and right thigh. The pain is moderate. The pain has been fluctuating since onset. Pertinent negatives include no inability to bear weight, loss of motion, loss of sensation, muscle weakness, numbness or tingling.    Lab Results  Component Value Date   CREATININE 1.15 01/31/2019   BUN 14 01/31/2019   NA 138 01/31/2019   K 3.3 (L) 01/31/2019   CL 97 01/31/2019   CO2 26 01/31/2019   Lab Results  Component Value Date   CHOL 126 08/10/2018   HDL 48 08/10/2018   LDLCALC 67 08/10/2018   TRIG 53 08/10/2018   CHOLHDL 2.8  08/04/2017   No results found for: TSH No results found for: HGBA1C No results found for: WBC, HGB, HCT, MCV, PLT Lab Results  Component Value Date   ALT 41 08/10/2018   AST 34 08/10/2018   ALKPHOS 51 08/10/2018   BILITOT 0.7 08/10/2018     Review of Systems  Constitutional: Negative for chills, fever and malaise/fatigue.  HENT: Negative for drooling, ear discharge, ear pain and sore throat.   Eyes: Negative for blurred vision.  Respiratory: Negative for cough, shortness of breath and wheezing.   Cardiovascular: Negative for chest pain, palpitations, orthopnea, leg swelling and PND.  Gastrointestinal: Negative for abdominal pain, blood in stool, constipation, diarrhea and nausea.  Endocrine: Negative for polydipsia.  Genitourinary: Negative for dysuria, frequency, hematuria and urgency.  Musculoskeletal: Negative for back pain, myalgias and neck pain.  Skin: Negative for rash.  Allergic/Immunologic: Negative for environmental allergies.  Neurological: Negative for dizziness, tingling, numbness and headaches.  Hematological: Does not bruise/bleed easily.  Psychiatric/Behavioral: Negative for suicidal ideas. The patient is not nervous/anxious.     Patient Active Problem List   Diagnosis Date Noted  . Atherosclerosis of both carotid arteries 02/04/2017  . Cerebral vascular disease 02/04/2017  . History of atrial fibrillation 02/04/2017  . 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 Pure hypercholesterolemia 09/11/2016  . Essential hypertension 11/29/2014    No Known Allergies  Past Surgical History:  Procedure Laterality Date  . COLONOSCOPY  2011   cleared for 5 yrs-    Social History   Tobacco Use  .  Smoking status: Never Smoker  . Smokeless tobacco: Never Used  Substance Use Topics  . Alcohol use: No    Alcohol/week: 0.0 standard drinks  . Drug use: No     Medication list has been reviewed and updated.  Current Meds  Medication Sig  .  amLODipine (NORVASC) 10 MG tablet Take 1 tablet (10 mg total) by mouth daily.  Marland Kitchen aspirin EC 81 MG tablet Take 1 tablet (81 mg total) by mouth daily.  Marland Kitchen atorvastatin (LIPITOR) 10 MG tablet Take 1 tablet (10 mg total) by mouth daily.  . cetirizine (ZYRTEC) 10 MG tablet Take 10 mg by mouth daily.  . hydrochlorothiazide (HYDRODIURIL) 25 MG tablet Take 1 tablet (25 mg total) by mouth daily.  . meloxicam (MOBIC) 15 MG tablet Take 1 tablet (15 mg total) by mouth daily.  . Potassium Chloride ER 20 MEQ TBCR Take 1 tablet by mouth daily.    PHQ 2/9 Scores 04/18/2019 03/23/2019 01/31/2019 08/10/2018  PHQ - 2 Score 0 0 0 0  PHQ- 9 Score 0 0 0 0    BP Readings from Last 3 Encounters:  04/18/19 (!) 150/100  03/23/19 138/80  01/31/19 138/88    Physical Exam Vitals and nursing note reviewed.  HENT:     Head: Normocephalic.     Right Ear: Tympanic membrane, ear canal and external ear normal.     Left Ear: Tympanic membrane, ear canal and external ear normal.     Nose: Nose normal.  Eyes:     General: No scleral icterus.       Right eye: No discharge.        Left eye: No discharge.     Conjunctiva/sclera: Conjunctivae normal.     Pupils: Pupils are equal, round, and reactive to light.  Neck:     Thyroid: No thyromegaly.     Vascular: No JVD.     Trachea: No tracheal deviation.  Cardiovascular:     Rate and Rhythm: Normal rate and regular rhythm.     Heart sounds: Normal heart sounds. No murmur. No friction rub. No gallop.   Pulmonary:     Effort: No respiratory distress.     Breath sounds: Normal breath sounds. No wheezing, rhonchi or rales.  Abdominal:     General: Bowel sounds are normal.     Palpations: Abdomen is soft. There is no mass.     Tenderness: There is no abdominal tenderness. There is no guarding or rebound.  Musculoskeletal:        General: No tenderness. Normal range of motion.     Cervical back: Normal range of motion and neck supple.  Lymphadenopathy:     Cervical: No  cervical adenopathy.  Skin:    General: Skin is warm.     Findings: No rash.  Neurological:     Mental Status: He is alert and oriented to person, place, and time.     Cranial Nerves: No cranial nerve deficit.     Deep Tendon Reflexes: Reflexes are normal and symmetric.     Wt Readings from Last 3 Encounters:  04/18/19 167 lb (75.8 kg)  03/23/19 169 lb (76.7 kg)  01/31/19 167 lb (75.8 kg)    BP (!) 150/100   Pulse (!) 124   Ht 5\' 8"  (1.727 m)   Wt 167 lb (75.8 kg)   BMI 25.39 kg/m   Assessment and Plan: 1. Tachycardia New onset.  Persistent.  Stable.  Patient was noted to have a heart rate  of 120 today on EKG.  This is not associated with irregularity of pulse.  EKG was done and was noted not to be in atrial fibrillation EKG reading as:I have reviewed EKG which shows tachycardia with rate of 120.  Normal intervals.  By criteria there is no noted ventricular hypertrophy.  No evidence of any ischemic changes such as T wave ST changes, R wave progression, and Q waves.. Comparison to previous EKG no change dated up-to-date.  In the meantime given the blood pressure reading with the tachycardia we are going to start on metoprolol 50 mg twice a day.  Will recheck in 1 week - EKG 12-Lead - metoprolol tartrate (LOPRESSOR) 50 MG tablet; Take 1 tablet (50 mg total) by mouth 2 (two) times daily.  Dispense: 30 tablet; Refill: 0  2. Essential hypertension Patient elevated readings at his orthopedist as well as today at our clinic we will add metoprolol 50 mg twice a day. - metoprolol tartrate (LOPRESSOR) 50 MG tablet; Take 1 tablet (50 mg total) by mouth 2 (two) times daily.  Dispense: 30 tablet; Refill: 0  3. Lumbar radiculopathy Patient with history of lumbar radiculopathy as noted by orthopedics on previous exam.  Patient was going to go on a Medrol Dosepak but we will begin some prednisone of 5 mg once a day letter for comfortable giving his present reading. - prednisoLONE 5 MG TABS  tablet; Take 1 tablet (5 mg total) by mouth daily.  Dispense: 30 tablet; Refill: 0  4. Aortic atherosclerosis (HCC) Patient with newly noted aortic atherosclerosis already taking low-dose ASA 81 mg enteric-coated.  Patient has been instructed to watch his diet and to lower his cholesterol.

## 2019-04-19 ENCOUNTER — Other Ambulatory Visit: Payer: Self-pay

## 2019-04-19 DIAGNOSIS — M5416 Radiculopathy, lumbar region: Secondary | ICD-10-CM

## 2019-04-19 MED ORDER — PREDNISONE 5 MG PO TABS: 5.0000 mg | ORAL_TABLET | Freq: Every day | ORAL | 0 refills | Status: DC

## 2019-04-21 ENCOUNTER — Telehealth: Payer: Self-pay

## 2019-04-21 NOTE — Telephone Encounter (Signed)
I received a call from Consuella Lose (nurse for [facility]) on pt. She stated that they gave patient an order for PT and they would like for pt to continue pred as ordered by Yetta Barre. No need for follow up with them at this time. She stated she had spoke to pt yesterday and he understood what to do.

## 2019-04-22 ENCOUNTER — Telehealth: Payer: Self-pay

## 2019-04-22 ENCOUNTER — Other Ambulatory Visit: Payer: Self-pay

## 2019-04-22 ENCOUNTER — Emergency Department
Admission: EM | Admit: 2019-04-22 | Discharge: 2019-04-22 | Disposition: A | Discharge status: Home / Self Care | Payer: Medicare PPO | Attending: Emergency Medicine | Admitting: Emergency Medicine

## 2019-04-22 ENCOUNTER — Encounter: Payer: Self-pay | Admitting: Emergency Medicine

## 2019-04-22 DIAGNOSIS — Z7982 Long term (current) use of aspirin: Secondary | ICD-10-CM | POA: Insufficient documentation

## 2019-04-22 DIAGNOSIS — Z79899 Other long term (current) drug therapy: Secondary | ICD-10-CM | POA: Diagnosis not present

## 2019-04-22 DIAGNOSIS — I10 Essential (primary) hypertension: Secondary | ICD-10-CM | POA: Insufficient documentation

## 2019-04-22 DIAGNOSIS — R1110 Vomiting, unspecified: Secondary | ICD-10-CM | POA: Diagnosis not present

## 2019-04-22 DIAGNOSIS — R112 Nausea with vomiting, unspecified: Secondary | ICD-10-CM | POA: Diagnosis not present

## 2019-04-22 DIAGNOSIS — R1111 Vomiting without nausea: Secondary | ICD-10-CM

## 2019-04-22 LAB — COMPREHENSIVE METABOLIC PANEL
ALT: 27 U/L (ref 0–44)
AST: 24 U/L (ref 15–41)
Albumin: 5.1 g/dL — ABNORMAL HIGH (ref 3.5–5.0)
Alkaline Phosphatase: 47 U/L (ref 38–126)
Anion gap: 14 (ref 5–15)
BUN: 16 mg/dL (ref 8–23)
CO2: 27 mmol/L (ref 22–32)
Calcium: 9.8 mg/dL (ref 8.9–10.3)
Chloride: 92 mmol/L — ABNORMAL LOW (ref 98–111)
Creatinine, Ser: 1.02 mg/dL (ref 0.61–1.24)
GFR calc Af Amer: >60 mL/min (ref >60)
GFR calc non Af Amer: >60 mL/min (ref >60)
Glucose, Bld: 121 mg/dL — ABNORMAL HIGH (ref 70–99)
Potassium: 3.2 mmol/L — ABNORMAL LOW (ref 3.5–5.1)
Sodium: 133 mmol/L — ABNORMAL LOW (ref 135–145)
Total Bilirubin: 1.2 mg/dL (ref 0.3–1.2)
Total Protein: 8.6 g/dL — ABNORMAL HIGH (ref 6.5–8.1)

## 2019-04-22 LAB — CBC
HCT: 44.5 % (ref 39.0–52.0)
Hemoglobin: 15.5 g/dL (ref 13.0–17.0)
MCH: 29.9 pg (ref 26.0–34.0)
MCHC: 34.8 g/dL (ref 30.0–36.0)
MCV: 85.9 fL (ref 80.0–100.0)
Platelets: 390 10*3/uL (ref 150–400)
RBC: 5.18 MIL/uL (ref 4.22–5.81)
RDW: 12.3 % (ref 11.5–15.5)
WBC: 11.9 10*3/uL — ABNORMAL HIGH (ref 4.0–10.5)
nRBC: 0 % (ref 0.0–0.2)

## 2019-04-22 LAB — LIPASE, BLOOD: Lipase: 20 U/L (ref 11–51)

## 2019-04-22 MED ORDER — SODIUM CHLORIDE 0.9% FLUSH: 3.0000 mL | Freq: Once | INTRAVENOUS | Status: DC

## 2019-04-22 MED ORDER — ONDANSETRON HCL 4 MG PO TABS: 4.0000 mg | ORAL_TABLET | Freq: Three times a day (TID) | ORAL | 0 refills | Status: AC | PRN

## 2019-04-22 MED ORDER — ONDANSETRON 4 MG PO TBDP
4.0000 mg | ORAL_TABLET | Freq: Once | ORAL | Status: AC
  Administered 2019-04-22: 4 mg via ORAL
  Filled 2019-04-22: qty 1

## 2019-04-22 NOTE — ED Triage Notes (Signed)
Pt states he was put on metoprolol for his b/p recently and prednisone for his right hip pain and since today having N/V.Marland Kitchen states his PCP was concerned about his b/p still being elevated.

## 2019-04-22 NOTE — Discharge Instructions (Addendum)
Make sure to eat breakfast every morning.  Take Zofran approximately 20 minutes after you eat breakfast.  Wait another 20 minutes and then take your blood pressure medication.  In the meantime, eat dinner.  Wait 20 minutes and then take Zofran.  Wait an additional 20 minutes and then take blood pressure medication.

## 2019-04-22 NOTE — Telephone Encounter (Signed)
Pt called this morning and left a vm stating that he is "not feeling good, I am sick." The patient sounded very different on the phone. I spoke to Dr Yetta Barre and she stated due to his elevated b/p readings and tachycardia, he needed to go to the ER. Pt is currently taking prednisone for hip pain and Metoprolol was added to regimen this week. I called pt back, he sounded like himself, however he stated he had "Just threw up everywhere." Pt was hesitant to go to ER, but called back shortly after and has decided to get checked out at ER. Wife will be taking him

## 2019-04-22 NOTE — ED Provider Notes (Signed)
Emergency Department Provider Note  ____________________________________________  Time seen: Approximately 4:49 PM  I have reviewed the triage vital signs and the nursing notes.   HISTORY  Chief Complaint Emesis   Historian Patient     HPI Christopher Browning is a 69 y.o. male with a history of hypertension, presents to the emergency department with an acute episode of emesis that occurred this morning after patient took his blood pressure medication.  Patient states that he took his blood pressure medication and then shortly afterward try to eat breakfast.  He became nauseated and had one episode of nonbloody emesis.  Patient denies abdominal pain, chest pain or chest tightness.  He states that nausea has improved since waiting in the emergency department.  Patient was recently started on metoprolol 50 mg twice daily by his primary care provider.  Patient states that he has been taking medication for the past 3 days and has been well-tolerated on the other days.  He denies fever and chills at home.  No diarrhea.  No other alleviating measures have been attempted.   Past Medical History:  Diagnosis Date  . Hypertension      Immunizations up to date:  Yes.     Past Medical History:  Diagnosis Date  . Hypertension     Patient Active Problem List   Diagnosis Date Noted  . Lumbar radiculopathy 04/18/2019  . Aortic atherosclerosis (HCC) 04/18/2019  . Atherosclerosis of both carotid arteries 02/04/2017  . Cerebral vascular disease 02/04/2017  . History of atrial fibrillation 02/04/2017  . 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 Pure hypercholesterolemia 09/11/2016  . Essential hypertension 11/29/2014    Past Surgical History:  Procedure Laterality Date  . COLONOSCOPY  2011   cleared for 5 yrs-    Prior to Admission medications   Medication Sig Start Date End Date Taking? Authorizing Provider  amLODipine (NORVASC) 10 MG tablet Take 1 tablet (10 mg  total) by mouth daily. 01/31/19   Duanne Limerick, MD  aspirin EC 81 MG tablet Take 1 tablet (81 mg total) by mouth daily. 01/31/19   Duanne Limerick, MD  atorvastatin (LIPITOR) 10 MG tablet Take 1 tablet (10 mg total) by mouth daily. 01/31/19   Duanne Limerick, MD  cetirizine (ZYRTEC) 10 MG tablet Take 10 mg by mouth daily.    [provider]  hydrochlorothiazide (HYDRODIURIL) 25 MG tablet Take 1 tablet (25 mg total) by mouth daily. 01/31/19   Duanne Limerick, MD  metoprolol tartrate (LOPRESSOR) 50 MG tablet Take 1 tablet (50 mg total) by mouth 2 (two) times daily. 04/18/19   Duanne Limerick, MD  ondansetron (ZOFRAN) 4 MG tablet Take 1 tablet (4 mg total) by mouth every 8 (eight) hours as needed for up to 5 days for nausea or vomiting. 04/22/19 04/27/19  Orvil Feil, PA-C  Potassium Chloride ER 20 MEQ TBCR Take 1 tablet by mouth daily. 01/31/19   Duanne Limerick, MD  predniSONE (DELTASONE) 5 MG tablet Take 1 tablet (5 mg total) by mouth daily with breakfast. 04/19/19   Duanne Limerick, MD    Allergies Patient has no known allergies.  No family history on file.  Social History Social History   Tobacco Use  . Smoking status: Never Smoker  . Smokeless tobacco: Never Used  Substance Use Topics  . Alcohol use: No    Alcohol/week: 0.0 standard drinks  . Drug use: No     Review of Systems  Constitutional: No fever/chills Eyes:  No discharge ENT: No upper respiratory complaints. Respiratory: no cough. No SOB/ use of accessory muscles to breath Gastrointestinal: Patient has nausea.  Musculoskeletal: Negative for musculoskeletal pain. Skin: Negative for rash, abrasions, lacerations, ecchymosis.   ____________________________________________   PHYSICAL EXAM:  VITAL SIGNS: ED Triage Vitals  Enc Vitals Group     BP 04/22/19 1303 (!) 139/100     Pulse Rate 04/22/19 1303 73     Resp 04/22/19 1303 17     Temp 04/22/19 1303 97.9 F (36.6 C)     Temp Source 04/22/19 1303 Oral      SpO2 04/22/19 1303 96 %     Weight 04/22/19 1302 167 lb (75.8 kg)     Height 04/22/19 1302 5\' 8"  (1.727 m)     Head Circumference --      Peak Flow --      Pain Score 04/22/19 1301 0     Pain Loc --      Pain Edu? --      Excl. in GC? --      Constitutional: Alert and oriented. Well appearing and in no acute distress. Eyes: Conjunctivae are normal. PERRL. EOMI. Head: Atraumatic. Cardiovascular: Normal rate, regular rhythm. Normal S1 and S2.  Good peripheral circulation. Respiratory: Normal respiratory effort without tachypnea or retractions. Lungs CTAB. Good air entry to the bases with no decreased or absent breath sounds Gastrointestinal: Bowel sounds x 4 quadrants. Soft and nontender to palpation. No guarding or rigidity. No distention. Musculoskeletal: Full range of motion to all extremities. No obvious deformities noted Neurologic:  Normal for age. No gross focal neurologic deficits are appreciated.  Skin:  Skin is warm, dry and intact. No rash noted. Psychiatric: Mood and affect are normal for age. Speech and behavior are normal.   ____________________________________________   LABS (all labs ordered are listed, but only abnormal results are displayed)  Labs Reviewed  COMPREHENSIVE METABOLIC PANEL - Abnormal; Notable for the following components:      Result Value   Sodium 133 (*)    Potassium 3.2 (*)    Chloride 92 (*)    Glucose, Bld 121 (*)    Total Protein 8.6 (*)    Albumin 5.1 (*)    All other components within normal limits  CBC - Abnormal; Notable for the following components:   WBC 11.9 (*)    All other components within normal limits  LIPASE, BLOOD  URINALYSIS, COMPLETE (UACMP) WITH MICROSCOPIC   ____________________________________________  EKG   ____________________________________________  RADIOLOGY  No results found.  ____________________________________________    PROCEDURES  Procedure(s) performed:      Procedures     Medications  sodium chloride flush (NS) 0.9 % injection 3 mL (has no administration in time range)  ondansetron (ZOFRAN-ODT) disintegrating tablet 4 mg (4 mg Oral Given 04/22/19 1656)     ____________________________________________   INITIAL IMPRESSION / ASSESSMENT AND PLAN / ED COURSE  Pertinent labs & imaging results that were available during my care of the patient were reviewed by me and considered in my medical decision making (see chart for details).      Assessment and plan Nausea 69 year old male presents to the emergency department with nausea and one episode of emesis that occurred after patient took his daily blood pressure medications without food.  Patient was hypertensive at triage but vital signs were otherwise reassuring.  On physical exam, patient was able to provide historical information and appeared to be resting comfortably.  Plan includes administering Zofran and will give patient crackers and ginger ale to pass a p.o. challenge.  Patient enjoyed snack in emergency department without nausea or emesis.  Gave patient instructions to consume food prior to taking blood pressure medication.  Also discharge patient home with Zofran if he experiences nausea with blood pressure medications in the future.  Return precautions were given to return with new or worsening symptoms.  All patient questions were answered.   ____________________________________________  FINAL CLINICAL IMPRESSION(S) / ED DIAGNOSES  Final diagnoses:  Vomiting without nausea, intractability of vomiting not specified, unspecified vomiting type      NEW MEDICATIONS STARTED DURING THIS VISIT:  ED Discharge Orders         Ordered    ondansetron (ZOFRAN) 4 MG tablet  Every 8 hours PRN     04/22/19 1751              This chart was dictated using voice recognition software/Dragon. Despite best efforts to proofread, errors can occur which can change the  meaning. Any change was purely unintentional.     Lannie Fields, PA-C 04/22/19 1754    Nance Pear, MD 04/22/19 [phone]

## 2019-04-25 ENCOUNTER — Other Ambulatory Visit: Payer: Self-pay

## 2019-04-25 ENCOUNTER — Ambulatory Visit: Payer: Medicare PPO | Admitting: Family Medicine

## 2019-04-25 ENCOUNTER — Encounter: Payer: Self-pay | Admitting: Family Medicine

## 2019-04-25 VITALS — BP 140/90 | HR 68 | Ht 68.0 in | Wt 164.0 lb

## 2019-04-25 DIAGNOSIS — R110 Nausea: Secondary | ICD-10-CM | POA: Diagnosis not present

## 2019-04-25 DIAGNOSIS — E876 Hypokalemia: Secondary | ICD-10-CM | POA: Diagnosis not present

## 2019-04-25 DIAGNOSIS — I10 Essential (primary) hypertension: Secondary | ICD-10-CM | POA: Diagnosis not present

## 2019-04-25 DIAGNOSIS — R000 Tachycardia, unspecified: Secondary | ICD-10-CM

## 2019-04-25 DIAGNOSIS — M5416 Radiculopathy, lumbar region: Secondary | ICD-10-CM

## 2019-04-25 MED ORDER — HYDROCHLOROTHIAZIDE 25 MG PO TABS: 25.0000 mg | ORAL_TABLET | Freq: Every day | ORAL | 1 refills | Status: DC

## 2019-04-25 MED ORDER — METOPROLOL TARTRATE 50 MG PO TABS: 50.0000 mg | ORAL_TABLET | Freq: Two times a day (BID) | ORAL | 1 refills | Status: DC

## 2019-04-25 MED ORDER — AMLODIPINE BESYLATE 10 MG PO TABS: 10.0000 mg | ORAL_TABLET | Freq: Every day | ORAL | 1 refills | Status: DC

## 2019-04-25 NOTE — Progress Notes (Signed)
Date:  04/25/2019   Name:  Christopher Browning   DOB:  1950-07-03   MRN:  [id number]   Chief Complaint: Follow-up (nausea)  Hypertension This is a new problem. The current episode started 1 to 4 weeks ago. The problem has been gradually improving since onset. The problem is controlled. Pertinent negatives include Browning anxiety, blurred vision, chest pain, headaches, malaise/fatigue, neck pain, orthopnea, palpitations, peripheral edema, PND, shortness of breath or sweats. There are Browning associated agents to hypertension. Past treatments include beta blockers. The current treatment provides moderate improvement. There are Browning compliance problems.  There is Browning history of angina, kidney disease, CAD/MI, CVA, heart failure, left ventricular hypertrophy, PVD or retinopathy. There is Browning history of chronic renal disease, a hypertension causing med or renovascular disease.    Lab Results  Component Value Date   CREATININE 1.02 04/22/2019   BUN 16 04/22/2019   NA 133 (L) 04/22/2019   K 3.2 (L) 04/22/2019   CL 92 (L) 04/22/2019   CO2 27 04/22/2019   Lab Results  Component Value Date   CHOL 126 08/10/2018   HDL 48 08/10/2018   LDLCALC 67 08/10/2018   TRIG 53 08/10/2018   CHOLHDL 2.8 08/04/2017   Browning results found for: TSH Browning results found for: HGBA1C Lab Results  Component Value Date   WBC 11.9 (H) 04/22/2019   HGB 15.5 04/22/2019   HCT 44.5 04/22/2019   MCV 85.9 04/22/2019   PLT 390 04/22/2019   Lab Results  Component Value Date   ALT 27 04/22/2019   AST 24 04/22/2019   ALKPHOS 47 04/22/2019   BILITOT 1.2 04/22/2019     Review of Systems  Constitutional: Negative for chills, fever and malaise/fatigue.  HENT: Negative for drooling, ear discharge, ear pain and sore throat.   Eyes: Negative for blurred vision.  Respiratory: Negative for cough, shortness of breath and wheezing.   Cardiovascular: Negative for chest pain, palpitations, orthopnea, leg swelling and PND.  Gastrointestinal:  Negative for abdominal pain, blood in stool, constipation, diarrhea and nausea.  Endocrine: Negative for polydipsia.  Genitourinary: Negative for dysuria, frequency, hematuria and urgency.  Musculoskeletal: Negative for back pain, myalgias and neck pain.  Skin: Negative for rash.  Allergic/Immunologic: Negative for environmental allergies.  Neurological: Negative for dizziness and headaches.  Hematological: Does not bruise/bleed easily.  Psychiatric/Behavioral: Negative for suicidal ideas. The patient is not nervous/anxious.     Patient Active Problem List   Diagnosis Date Noted  . Lumbar radiculopathy 04/18/2019  . Aortic atherosclerosis ([place]) 04/18/2019  . Atherosclerosis of both carotid arteries 02/04/2017  . Cerebral vascular disease 02/04/2017  . History of atrial fibrillation 02/04/2017  . 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 Pure hypercholesterolemia 09/11/2016  . Essential hypertension 11/29/2014    Browning Known Allergies  Past Surgical History:  Procedure Laterality Date  . COLONOSCOPY  2011   cleared for 5 yrs-    Social History   Tobacco Use  . Smoking status: Never Smoker  . Smokeless tobacco: Never Used  Substance Use Topics  . Alcohol use: Browning    Alcohol/week: 0.0 standard drinks  . Drug use: Browning     Medication list has been reviewed and updated.  Current Meds  Medication Sig  . amLODipine (NORVASC) 10 MG tablet Take 1 tablet (10 mg total) by mouth daily.  Marland Kitchen aspirin EC 81 MG tablet Take 1 tablet (81 mg total) by mouth daily.  Marland Kitchen atorvastatin (LIPITOR) 10 MG tablet  Take 1 tablet (10 mg total) by mouth daily.  . cetirizine (ZYRTEC) 10 MG tablet Take 10 mg by mouth daily.  . hydrochlorothiazide (HYDRODIURIL) 25 MG tablet Take 1 tablet (25 mg total) by mouth daily.  . metoprolol tartrate (LOPRESSOR) 50 MG tablet Take 1 tablet (50 mg total) by mouth 2 (two) times daily.  . ondansetron (ZOFRAN) 4 MG tablet Take 1 tablet (4 mg total) by mouth  every 8 (eight) hours as needed for up to 5 days for nausea or vomiting.  . Potassium Chloride ER 20 MEQ TBCR Take 1 tablet by mouth daily.  . predniSONE (DELTASONE) 5 MG tablet Take 1 tablet (5 mg total) by mouth daily with breakfast.    PHQ 2/9 Scores 04/25/2019 04/18/2019 03/23/2019 01/31/2019  PHQ - 2 Score 0 0 0 0  PHQ- 9 Score 0 0 0 0    BP Readings from Last 3 Encounters:  04/25/19 140/90  04/22/19 (!) 139/100  04/18/19 (!) 150/100    Physical Exam Vitals and nursing note reviewed.  HENT:     Head: Normocephalic.     Right Ear: External ear normal.     Left Ear: External ear normal.     Nose: Nose normal.     Mouth/Throat:     Mouth: Mucous membranes are moist.  Eyes:     General: Browning scleral icterus.       Right eye: Browning discharge.        Left eye: Browning discharge.     Conjunctiva/sclera: Conjunctivae normal.     Pupils: Pupils are equal, round, and reactive to light.  Neck:     Thyroid: Browning thyromegaly.     Vascular: Browning JVD.     Trachea: Browning tracheal deviation.  Cardiovascular:     Rate and Rhythm: Normal rate and regular rhythm.     Heart sounds: Normal heart sounds. Browning murmur. Browning friction rub. Browning gallop.   Pulmonary:     Effort: Browning respiratory distress.     Breath sounds: Normal breath sounds. Browning wheezing, rhonchi or rales.  Abdominal:     General: Bowel sounds are normal.     Palpations: Abdomen is soft. There is Browning mass.     Tenderness: There is Browning abdominal tenderness. There is Browning guarding or rebound.  Musculoskeletal:        General: Browning tenderness. Normal range of motion.     Cervical back: Normal range of motion and neck supple.  Lymphadenopathy:     Cervical: Browning cervical adenopathy.  Skin:    General: Skin is warm.     Capillary Refill: Capillary refill takes less than 2 seconds.     Findings: Browning rash.  Neurological:     Mental Status: He is alert and oriented to person, place, and time.     Cranial Nerves: Browning cranial nerve deficit.     Deep Tendon  Reflexes: Reflexes are normal and symmetric.     Wt Readings from Last 3 Encounters:  04/25/19 164 lb (74.4 kg)  04/22/19 167 lb 1.7 oz (75.8 kg)  04/18/19 167 lb (75.8 kg)    BP 140/90   Pulse 68   Ht 5\' 8"  (1.727 m)   Wt 164 lb (74.4 kg)   BMI 24.94 kg/m   Assessment and Plan: 1. Essential hypertension Chronic.  Controlled.  Stable.  At the upper limits of normal 140/90.  Patient will continue amlodipine 10 mg, hydrochlorothiazide 25 mg once a day and metoprolol 50 mg twice  a day.  Will check renal function panel to assess previously noted hyponatremia, hyperkalemia. - Renal function panel - amLODipine (NORVASC) 10 MG tablet; Take 1 tablet (10 mg total) by mouth daily.  Dispense: 90 tablet; Refill: 1 - hydrochlorothiazide (HYDRODIURIL) 25 MG tablet; Take 1 tablet (25 mg total) by mouth daily.  Dispense: 90 tablet; Refill: 1 - metoprolol tartrate (LOPRESSOR) 50 MG tablet; Take 1 tablet (50 mg total) by mouth 2 (two) times daily.  Dispense: 180 tablet; Refill: 1  2. Tachycardia Chronic.  Controlled.  Stable.  Pulse rate has returned to a level that is more acceptable pulse rate of 68.  Will continue metoprolol 50 mg twice a day.  We will recheck patient in 6 months - Renal function panel - metoprolol tartrate (LOPRESSOR) 50 MG tablet; Take 1 tablet (50 mg total) by mouth 2 (two) times daily.  Dispense: 180 tablet; Refill: 1  3. Hypokalemia Chronic.  Uncontrolled.  Stable.  Patient's been in the range of 3.2 with his potassium on previous and seen that he is on a diuretic this may be causing some secondary hypokalemia.  Patient is currently on 20 mEq KCl.  We will recheck renal function panel and I have discussed patient's that we may need to supplement with a small amount of KCl in the 30 mEq range and patient understands but is somewhat cautious about taking new medications. - Renal function panel  4. Nausea Acute.  Resolved.  Patient was having nausea probably secondary to the  prednisone Dosepak that he was taking for lumbar radiculopathy.  Patient has been instructed to separate them to take it more concentrating with meals.  Patient has Zofran to take as needed acute nausea with emesis and will continue to do so.  5. 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  Patient has been instructed that test such as weed eating and mowing is probably not a good idea at this time until things have settled down with his discopathy.  Patient will continue the Pred pack and is to follow-up with orthopedics.

## 2019-04-26 LAB — RENAL FUNCTION PANEL
Albumin: 4.8 g/dL (ref 3.8–4.8)
BUN/Creatinine Ratio: 11 (ref 10–24)
BUN: 12 mg/dL (ref 8–27)
CO2: 27 mmol/L (ref 20–29)
Calcium: 10.3 mg/dL — ABNORMAL HIGH (ref 8.6–10.2)
Chloride: 91 mmol/L — ABNORMAL LOW (ref 96–106)
Creatinine, Ser: 1.14 mg/dL (ref 0.76–1.27)
GFR calc Af Amer: 77 mL/min/{1.73_m2} (ref >59)
GFR calc non Af Amer: 66 mL/min/{1.73_m2} (ref >59)
Glucose: 123 mg/dL — ABNORMAL HIGH (ref 65–99)
Phosphorus: 2.6 mg/dL — ABNORMAL LOW (ref 2.8–4.1)
Potassium: 3.5 mmol/L (ref 3.5–5.2)
Sodium: 134 mmol/L (ref 134–144)

## 2019-05-02 DIAGNOSIS — M5416 Radiculopathy, lumbar region: Secondary | ICD-10-CM | POA: Diagnosis not present

## 2019-05-02 DIAGNOSIS — M6281 Muscle weakness (generalized): Secondary | ICD-10-CM | POA: Diagnosis not present

## 2019-05-02 DIAGNOSIS — M5441 Lumbago with sciatica, right side: Secondary | ICD-10-CM | POA: Diagnosis not present

## 2019-05-09 DIAGNOSIS — M5416 Radiculopathy, lumbar region: Secondary | ICD-10-CM | POA: Diagnosis not present

## 2019-05-09 DIAGNOSIS — M5441 Lumbago with sciatica, right side: Secondary | ICD-10-CM | POA: Diagnosis not present

## 2019-05-09 DIAGNOSIS — M6281 Muscle weakness (generalized): Secondary | ICD-10-CM | POA: Diagnosis not present

## 2019-05-16 DIAGNOSIS — M5416 Radiculopathy, lumbar region: Secondary | ICD-10-CM | POA: Diagnosis not present

## 2019-05-16 DIAGNOSIS — M5441 Lumbago with sciatica, right side: Secondary | ICD-10-CM | POA: Diagnosis not present

## 2019-05-16 DIAGNOSIS — M6281 Muscle weakness (generalized): Secondary | ICD-10-CM | POA: Diagnosis not present

## 2019-05-23 DIAGNOSIS — M5441 Lumbago with sciatica, right side: Secondary | ICD-10-CM | POA: Diagnosis not present

## 2019-05-23 DIAGNOSIS — M5416 Radiculopathy, lumbar region: Secondary | ICD-10-CM | POA: Diagnosis not present

## 2019-05-23 DIAGNOSIS — M6281 Muscle weakness (generalized): Secondary | ICD-10-CM | POA: Diagnosis not present

## 2019-07-11 IMAGING — US US CAROTID DUPLEX BILAT
1 series · 13 of 24 positions shown · non-contrast
Comparison: No recent prior.

CLINICAL DATA: TIA.

EXAM:
BILATERAL CAROTID DUPLEX ULTRASOUND
TECHNIQUE: Gray scale imaging, color Doppler and duplex ultrasound were
performed of bilateral carotid and vertebral arteries in the neck.

[Series 1: us carotid duplex bilat · 13 of 64 slices shown]
[im 1/64]
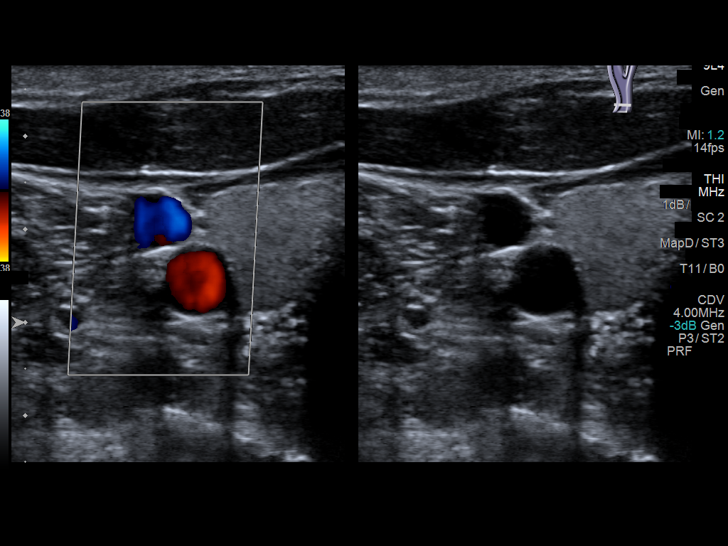
[im 6/64]
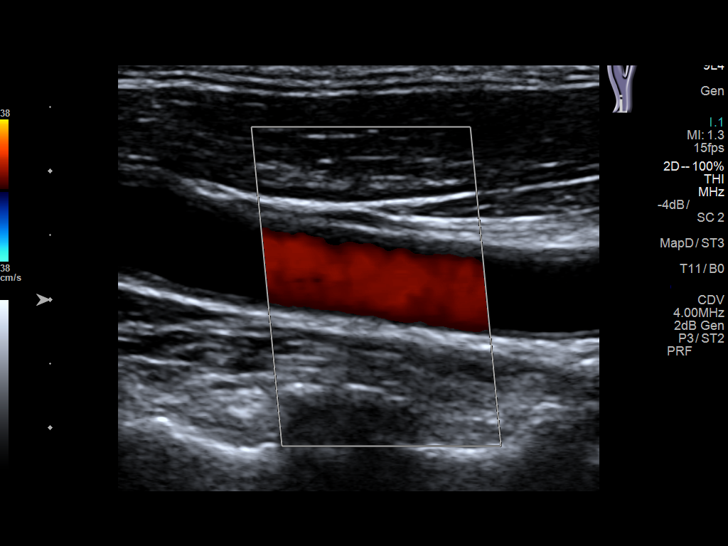
[im 11/64]
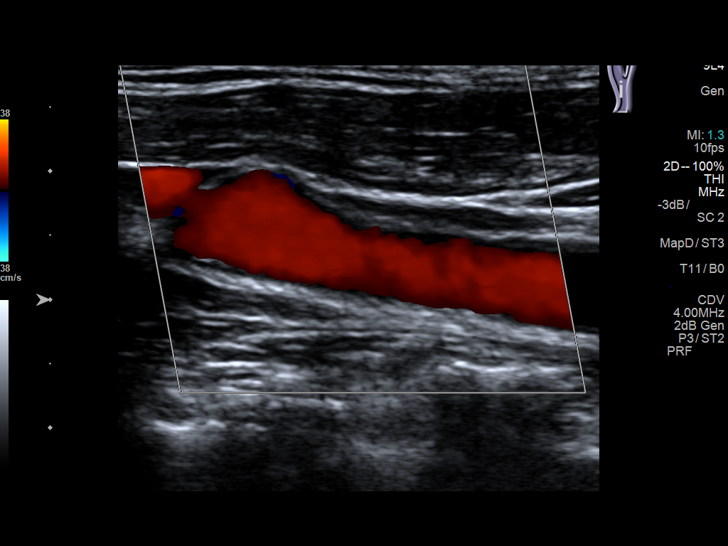
[im 17/64]
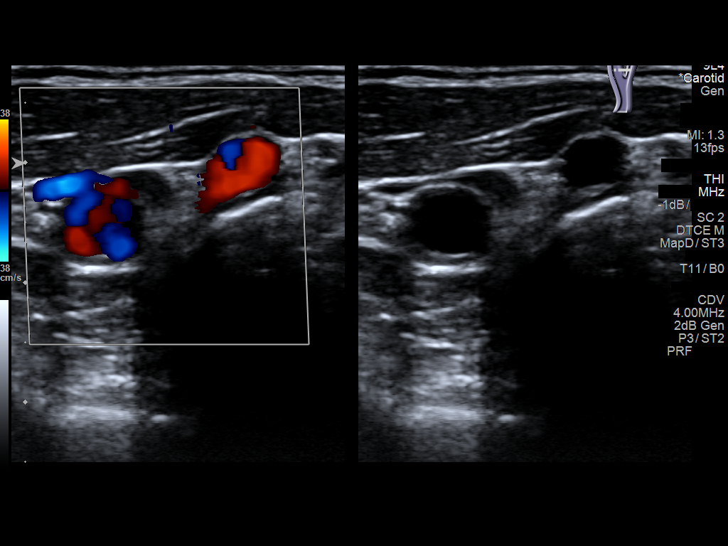
[im 22/64]
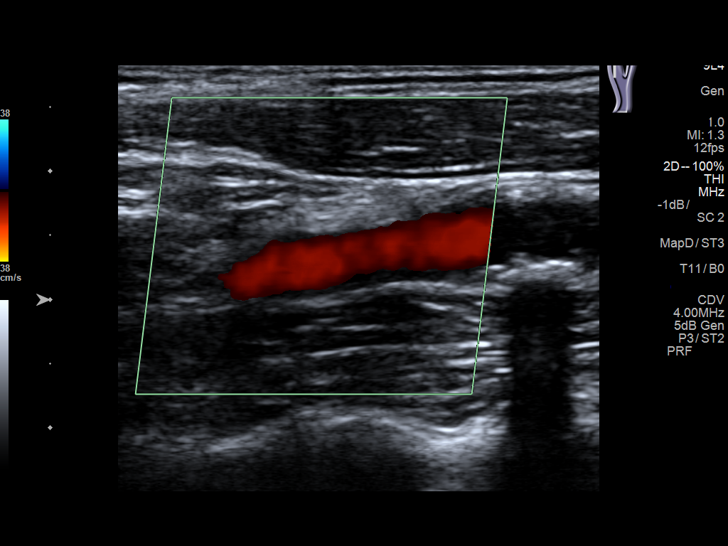
[im 28/64]
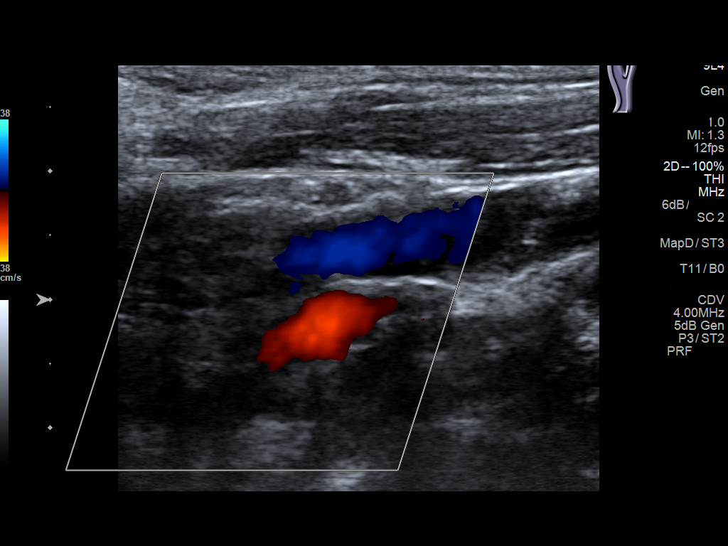
[im 33/64]
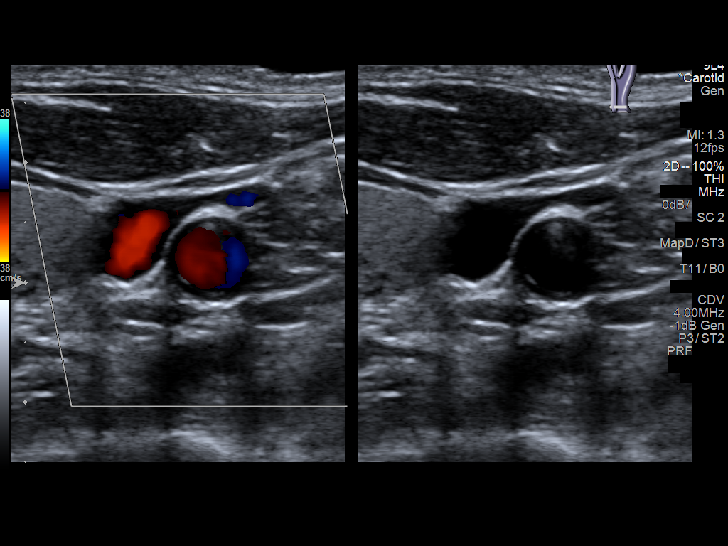
[im 36/64]
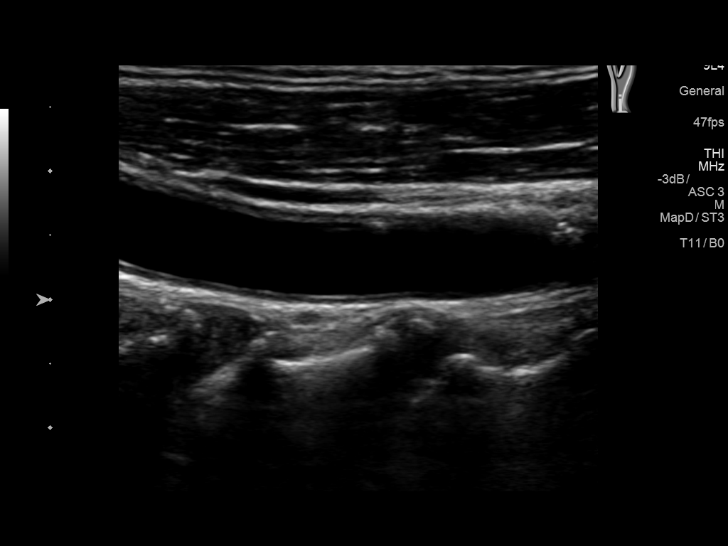
[im 42/64]
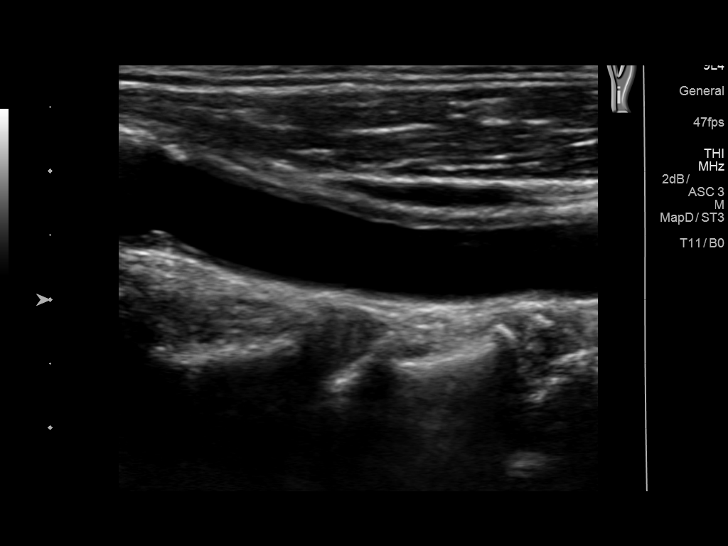
[im 47/64]
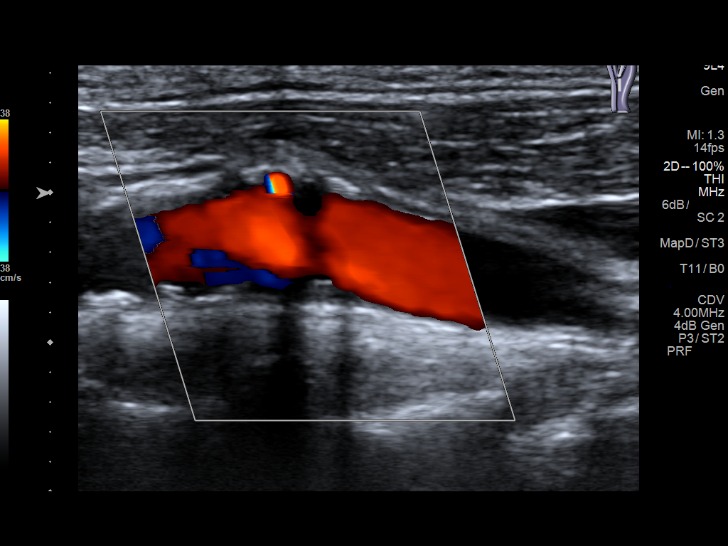
[im 53/64]
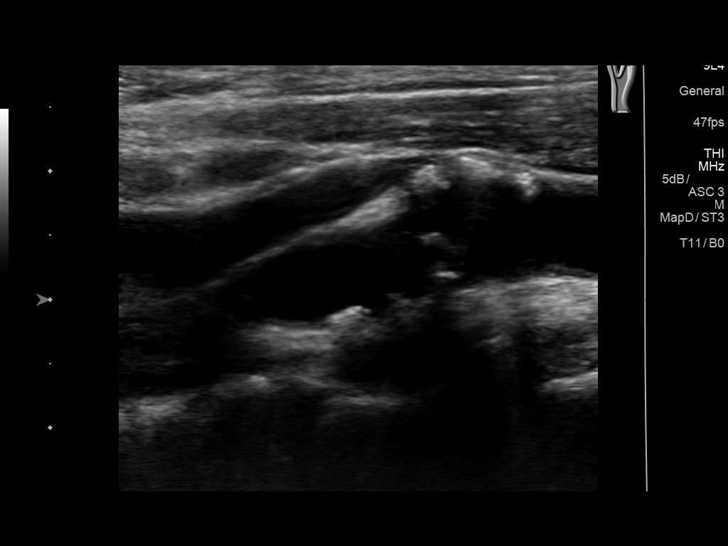
[im 58/64]
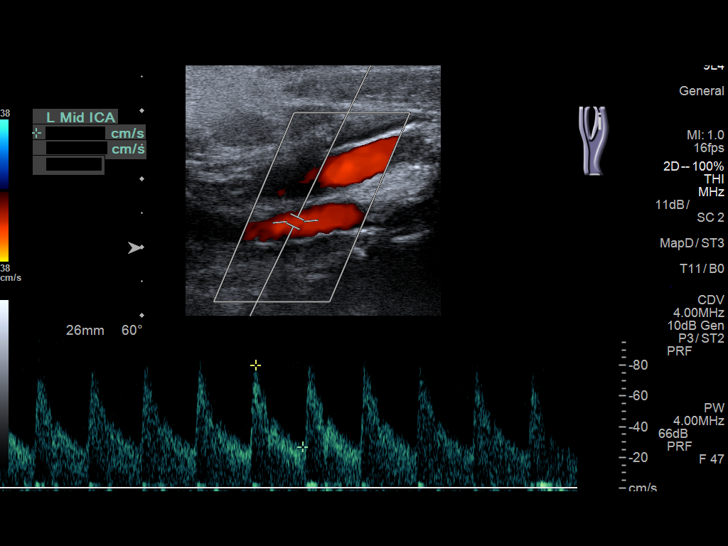
[im 64/64]
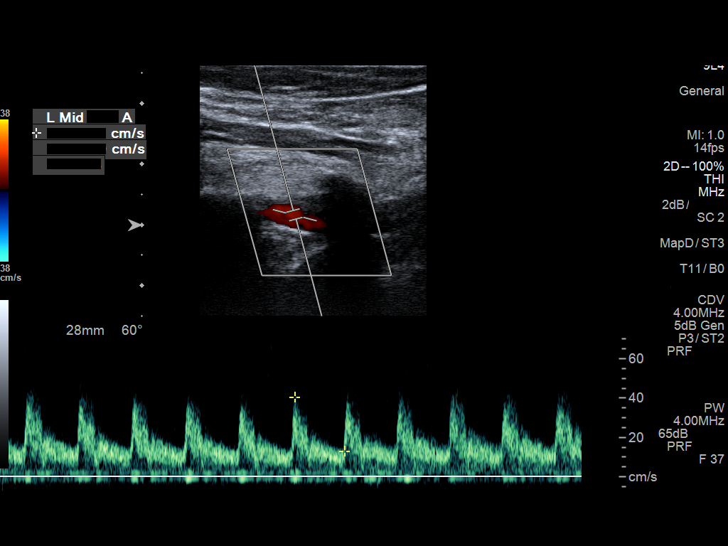

[13 of 24 positions shown; findings below may reference images not displayed]

FINDINGS: Criteria: Quantification of carotid stenosis is based on velocity
parameters that correlate the residual internal carotid diameter
with NASCET-based stenosis levels, using the diameter of the distal
internal carotid lumen as the denominator for stenosis measurement.

The following velocity measurements were obtained:

RIGHT

ICA:  79/26 cm/sec

CCA:  92/20 cm/sec

SYSTOLIC ICA/CCA RATIO:

DIASTOLIC ICA/CCA RATIO:

ECA:  141 cm/sec

LEFT

ICA:  80/27 cm/sec

CCA:  107/25 cm/sec

SYSTOLIC ICA/CCA RATIO:

DIASTOLIC ICA/CCA RATIO:

ECA:  13.5 cm/sec

RIGHT CAROTID ARTERY: Mild right carotid bifurcation atherosclerotic
vascular disease. No flow limiting stenosis.

RIGHT VERTEBRAL ARTERY:  Patent with antegrade flow.

LEFT CAROTID ARTERY: Mild left common carotid, carotid bifurcation,
and proximal ICA atherosclerotic vascular disease. No flow limiting
stenosis.

LEFT VERTEBRAL ARTERY:  Patent with antegrade flow .
IMPRESSION: Mild bilateral carotid atherosclerotic vascular disease. No flow
limiting stenosis. Degree of stenosis less than 50% bilaterally.

2. Vertebrals are patent with antegrade flow.

## 2019-07-29 ENCOUNTER — Other Ambulatory Visit: Payer: Self-pay

## 2019-07-29 ENCOUNTER — Ambulatory Visit: Payer: Medicare PPO | Admitting: Family Medicine

## 2019-07-29 ENCOUNTER — Encounter: Payer: Self-pay | Admitting: Family Medicine

## 2019-07-29 VITALS — BP 150/80 | HR 84 | Ht 68.0 in | Wt 157.0 lb

## 2019-07-29 DIAGNOSIS — E7800 Pure hypercholesterolemia, unspecified: Secondary | ICD-10-CM | POA: Diagnosis not present

## 2019-07-29 DIAGNOSIS — R739 Hyperglycemia, unspecified: Secondary | ICD-10-CM | POA: Diagnosis not present

## 2019-07-29 DIAGNOSIS — I700 Atherosclerosis of aorta: Secondary | ICD-10-CM

## 2019-07-29 DIAGNOSIS — I10 Essential (primary) hypertension: Secondary | ICD-10-CM | POA: Diagnosis not present

## 2019-07-29 DIAGNOSIS — E876 Hypokalemia: Secondary | ICD-10-CM

## 2019-07-29 DIAGNOSIS — Z1211 Encounter for screening for malignant neoplasm of colon: Secondary | ICD-10-CM

## 2019-07-29 MED ORDER — METOPROLOL SUCCINATE ER 50 MG PO TB24: 50.0000 mg | ORAL_TABLET | Freq: Every day | ORAL | 1 refills | Status: DC

## 2019-07-29 MED ORDER — HYDROCHLOROTHIAZIDE 25 MG PO TABS: 25.0000 mg | ORAL_TABLET | Freq: Every day | ORAL | 1 refills | Status: DC

## 2019-07-29 MED ORDER — AMLODIPINE BESYLATE 10 MG PO TABS: 10.0000 mg | ORAL_TABLET | Freq: Every day | ORAL | 1 refills | Status: DC

## 2019-07-29 MED ORDER — POTASSIUM CHLORIDE ER 20 MEQ PO TBCR: 1.0000 | EXTENDED_RELEASE_TABLET | Freq: Every day | ORAL | 1 refills | Status: DC

## 2019-07-29 MED ORDER — ATORVASTATIN CALCIUM 10 MG PO TABS: 10.0000 mg | ORAL_TABLET | Freq: Every day | ORAL | 1 refills | Status: DC

## 2019-07-29 NOTE — Progress Notes (Signed)
Date:  07/29/2019   Name:  Christopher Browning   DOB:  Sep 17, 1950   MRN:  [id number]   Chief Complaint: Hypertension, Hyperlipidemia, and hypokalemia  Hypertension This is a chronic problem. The current episode started more than 1 year ago. The problem has been gradually improving since onset. The problem is controlled. Pertinent negatives include no anxiety, blurred vision, chest pain, headaches, malaise/fatigue, neck pain, orthopnea, palpitations, peripheral edema, PND, shortness of breath or sweats. There are no associated agents to hypertension. There are no known risk factors for coronary artery disease. Past treatments include calcium channel blockers and diuretics. The current treatment provides moderate improvement. There are no compliance problems.  There is no history of angina, kidney disease, CAD/MI, CVA, heart failure, left ventricular hypertrophy, PVD or retinopathy. There is no history of chronic renal disease, a hypertension causing med or renovascular disease.  Hyperlipidemia This is a chronic problem. The current episode started more than 1 year ago. The problem is controlled. Recent lipid tests were reviewed and are normal. He has no history of chronic renal disease. Factors aggravating his hyperlipidemia include thiazides. Pertinent negatives include no chest pain, focal sensory loss, focal weakness, leg pain, myalgias or shortness of breath. Current antihyperlipidemic treatment includes statins. The current treatment provides moderate improvement of lipids. There are no compliance problems.  Risk factors for coronary artery disease include dyslipidemia and hypertension.    Lab Results  Component Value Date   CREATININE 1.14 04/25/2019   BUN 12 04/25/2019   NA 134 04/25/2019   K 3.5 04/25/2019   CL 91 (L) 04/25/2019   CO2 27 04/25/2019   Lab Results  Component Value Date   CHOL 126 08/10/2018   HDL 48 08/10/2018   LDLCALC 67 08/10/2018   TRIG 53 08/10/2018   CHOLHDL 2.8  08/04/2017   No results found for: TSH No results found for: HGBA1C Lab Results  Component Value Date   WBC 11.9 (H) 04/22/2019   HGB 15.5 04/22/2019   HCT 44.5 04/22/2019   MCV 85.9 04/22/2019   PLT 390 04/22/2019   Lab Results  Component Value Date   ALT 27 04/22/2019   AST 24 04/22/2019   ALKPHOS 47 04/22/2019   BILITOT 1.2 04/22/2019     Review of Systems  Constitutional: Negative for chills, fever and malaise/fatigue.  HENT: Negative for drooling, ear discharge, ear pain and sore throat.   Eyes: Negative for blurred vision.  Respiratory: Negative for cough, shortness of breath and wheezing.   Cardiovascular: Negative for chest pain, palpitations, orthopnea, leg swelling and PND.  Gastrointestinal: Negative for abdominal pain, blood in stool, constipation, diarrhea and nausea.  Endocrine: Negative for polydipsia.  Genitourinary: Negative for dysuria, frequency, hematuria and urgency.  Musculoskeletal: Negative for back pain, myalgias and neck pain.  Skin: Negative for rash.  Allergic/Immunologic: Negative for environmental allergies.  Neurological: Negative for dizziness, focal weakness and headaches.  Hematological: Does not bruise/bleed easily.  Psychiatric/Behavioral: Negative for suicidal ideas. The patient is not nervous/anxious.     Patient Active Problem List   Diagnosis Date Noted  . Lumbar radiculopathy 04/18/2019  . Aortic atherosclerosis ([facility]) 04/18/2019  . Atherosclerosis of both carotid arteries 02/04/2017  . Cerebral vascular disease 02/04/2017  . History of atrial fibrillation 02/04/2017  . 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 Pure hypercholesterolemia 09/11/2016  . Essential hypertension 11/29/2014    No Known Allergies  Past Surgical History:  Procedure Laterality Date  . COLONOSCOPY  2011  cleared for 5 yrs-    Social History   Tobacco Use  . Smoking status: Never Smoker  . Smokeless tobacco: Never Used   Substance Use Topics  . Alcohol use: No    Alcohol/week: 0.0 standard drinks  . Drug use: No     Medication list has been reviewed and updated.  Current Meds  Medication Sig  . amLODipine (NORVASC) 10 MG tablet Take 1 tablet (10 mg total) by mouth daily.  Marland Kitchen aspirin EC 81 MG tablet Take 1 tablet (81 mg total) by mouth daily.  Marland Kitchen atorvastatin (LIPITOR) 10 MG tablet Take 1 tablet (10 mg total) by mouth daily.  . cetirizine (ZYRTEC) 10 MG tablet Take 10 mg by mouth daily.  . hydrochlorothiazide (HYDRODIURIL) 25 MG tablet Take 1 tablet (25 mg total) by mouth daily.  . Potassium Chloride ER 20 MEQ TBCR Take 1 tablet by mouth daily.  . [DISCONTINUED] predniSONE (DELTASONE) 5 MG tablet Take 1 tablet (5 mg total) by mouth daily with breakfast.    PHQ 2/9 Scores 07/29/2019 04/25/2019 04/18/2019 03/23/2019  PHQ - 2 Score 0 0 0 0  PHQ- 9 Score 0 0 0 0    GAD 7 : Generalized Anxiety Score 07/29/2019 04/25/2019 04/18/2019 03/23/2019  Nervous, Anxious, on Edge 0 0 0 0  Control/stop worrying 0 0 0 0  Worry too much - different things 0 0 0 0  Trouble relaxing 0 0 0 0  Restless 0 0 0 0  Easily annoyed or irritable 0 0 0 0  Afraid - awful might happen 0 0 0 0  Total GAD 7 Score 0 0 0 0  Anxiety Difficulty - - Not difficult at all -    BP Readings from Last 3 Encounters:  07/29/19 (!) 150/80  04/25/19 140/90  04/22/19 (!) 139/100    Physical Exam Vitals and nursing note reviewed.  HENT:     Head: Normocephalic.     Right Ear: Tympanic membrane, ear canal and external ear normal.     Left Ear: Tympanic membrane, ear canal and external ear normal.     Nose: Nose normal.  Eyes:     General: No scleral icterus.       Right eye: No discharge.        Left eye: No discharge.     Conjunctiva/sclera: Conjunctivae normal.     Pupils: Pupils are equal, round, and reactive to light.  Neck:     Thyroid: No thyromegaly.     Vascular: No JVD.     Trachea: No tracheal deviation.  Cardiovascular:      Rate and Rhythm: Normal rate and regular rhythm.     Pulses:          Radial pulses are 2+ on the right side and 2+ on the left side.     Heart sounds: Normal heart sounds. No murmur heard.  No friction rub. No gallop.   Pulmonary:     Effort: No respiratory distress.     Breath sounds: Normal breath sounds. No stridor. No wheezing, rhonchi or rales.  Chest:     Chest wall: No tenderness.  Abdominal:     General: Bowel sounds are normal.     Palpations: Abdomen is soft. There is no mass.     Tenderness: There is no abdominal tenderness. There is no guarding or rebound.  Musculoskeletal:        General: No tenderness. Normal range of motion.     Cervical back: Normal  range of motion and neck supple.  Lymphadenopathy:     Cervical: No cervical adenopathy.  Skin:    General: Skin is warm.     Findings: No rash.  Neurological:     Mental Status: He is alert and oriented to person, place, and time.     Cranial Nerves: No cranial nerve deficit.     Deep Tendon Reflexes: Reflexes are normal and symmetric.     Wt Readings from Last 3 Encounters:  07/29/19 157 lb (71.2 kg)  04/25/19 164 lb (74.4 kg)  04/22/19 167 lb 1.7 oz (75.8 kg)    BP (!) 150/80   Pulse 84   Ht 5\' 8"  (1.727 m)   Wt 157 lb (71.2 kg)   BMI 23.87 kg/m   Assessment and Plan: 1. Essential hypertension Chronic.  Controlled.  Stable.  Continue amlodipine 10 mg once a day hydrochlorothiazide 25 mg once a day and patient will resume metoprolol but it will be in XL form 50 mg once a day.  Will recheck blood pressure in 6 months. - amLODipine (NORVASC) 10 MG tablet; Take 1 tablet (10 mg total) by mouth daily.  Dispense: 90 tablet; Refill: 1 - hydrochlorothiazide (HYDRODIURIL) 25 MG tablet; Take 1 tablet (25 mg total) by mouth daily.  Dispense: 90 tablet; Refill: 1 - metoprolol succinate (TOPROL-XL) 50 MG 24 hr tablet; Take 1 tablet (50 mg total) by mouth daily. Take with or immediately following a meal.  Dispense: 90  tablet; Refill: 1  2. Pure hypercholesterolemia Chronic.  Controlled.  Stable.  Patient will continue atorvastatin 10 mg once a day.  Will check lipid panel for current status. - Lipid Panel With LDL/HDL Ratio - atorvastatin (LIPITOR) 10 MG tablet; Take 1 tablet (10 mg total) by mouth daily.  Dispense: 90 tablet; Refill: 1  3. Hypokalemia Chronic.  Controlled.  Stable.  Secondary to iatrogenic concerns due to diuretic use.  Will check electrolytes for current level.  In the meantime patient will be continued potassium supplementation with potassium chloride 20 mEq. - Potassium Chloride ER 20 MEQ TBCR; Take 1 tablet by mouth daily.  Dispense: 90 tablet; Refill: 1  4. Hyperglycemia Chronic.  Controlled.  Stable.  Will check A1c to rule out possibility of prediabetes. - Hemoglobin A1c  5. Colon cancer screening Discussed with patient who prefers not to have colonoscopy but will trial FIT. - Fecal occult blood, imunochemical  6. Aortic atherosclerosis ([facility]) Discussed the importance of controlling the blood pressure lipid and weight as well as dietary discretion.

## 2019-07-30 LAB — HEMOGLOBIN A1C
Est. average glucose Bld gHb Est-mCnc: 114 mg/dL
Hgb A1c MFr Bld: 5.6 % (ref 4.8–5.6)

## 2019-07-30 LAB — LIPID PANEL WITH LDL/HDL RATIO
Cholesterol, Total: 128 mg/dL (ref 100–199)
HDL: 48 mg/dL (ref >39)
LDL Chol Calc (NIH): 67 mg/dL (ref 0–99)
LDL/HDL Ratio: 1.4 ratio (ref 0.0–3.6)
Triglycerides: 58 mg/dL (ref 0–149)
VLDL Cholesterol Cal: 13 mg/dL (ref 5–40)

## 2019-08-03 DIAGNOSIS — Z1211 Encounter for screening for malignant neoplasm of colon: Secondary | ICD-10-CM | POA: Diagnosis not present

## 2019-08-05 LAB — FECAL OCCULT BLOOD, IMMUNOCHEMICAL: Fecal Occult Bld: NEGATIVE

## 2019-09-07 ENCOUNTER — Ambulatory Visit (INDEPENDENT_AMBULATORY_CARE_PROVIDER_SITE_OTHER): Payer: Medicare PPO

## 2019-09-07 DIAGNOSIS — Z0100 Encounter for examination of eyes and vision without abnormal findings: Secondary | ICD-10-CM | POA: Diagnosis not present

## 2019-09-07 DIAGNOSIS — Z0000 Encounter for general adult medical examination without abnormal findings: Secondary | ICD-10-CM

## 2019-09-07 NOTE — Patient Instructions (Signed)
[name] , Thank you for taking time to come for your Medicare Wellness Visit. I appreciate your ongoing commitment to your health goals. Please review the following plan we discussed and let me know if I can assist you in the future.   Screening recommendations/referrals: Colonoscopy: declined Recommended yearly ophthalmology/optometry visit for glaucoma screening and checkup Recommended yearly dental visit for hygiene and checkup  Vaccinations: Influenza vaccine: done 10/28/18 Pneumococcal vaccine: done 01/31/19 Tdap vaccine: done 11/29/15 Shingles vaccine: Shingrix discussed. Please contact your pharmacy for coverage information.  Covid-19: done 03/11/19 & 04/08/19  Advanced directives: Advance directive discussed with you today. I have provided a copy for you to complete at home and have notarized. Once this is complete please bring a copy in to our office so we can scan it into your chart.  Conditions/risks identified: Recommend drinking 6-8 glasses of water per day   Next appointment: Follow up in one year for your annual wellness visit.   Preventive Care 67 Years and Older, Male Preventive care refers to lifestyle choices and visits with your health care provider that can promote health and wellness. What does preventive care include?  A yearly physical exam. This is also called an annual well check.  Dental exams once or twice a year.  Routine eye exams. Ask your health care provider how often you should have your eyes checked.  Personal lifestyle choices, including:  Daily care of your teeth and gums.  Regular physical activity.  Eating a healthy diet.  Avoiding tobacco and drug use.  Limiting alcohol use.  Practicing safe sex.  Taking low doses of aspirin every day.  Taking vitamin and mineral supplements as recommended by your health care provider. What happens during an annual well check? The services and screenings done by your health care provider during your  annual well check will depend on your age, overall health, lifestyle risk factors, and family history of disease. Counseling  Your health care provider may ask you questions about your:  Alcohol use.  Tobacco use.  Drug use.  Emotional well-being.  Home and relationship well-being.  Sexual activity.  Eating habits.  History of falls.  Memory and ability to understand (cognition).  Work and work Astronomer. Screening  You may have the following tests or measurements:  Height, weight, and BMI.  Blood pressure.  Lipid and cholesterol levels. These may be checked every 5 years, or more frequently if you are over 41 years old.  Skin check.  Lung cancer screening. You may have this screening every year starting at age 66 if you have a 30-pack-year history of smoking and currently smoke or have quit within the past 15 years.  Fecal occult blood test (FOBT) of the stool. You may have this test every year starting at age 50.  Flexible sigmoidoscopy or colonoscopy. You may have a sigmoidoscopy every 5 years or a colonoscopy every 10 years starting at age 35.  Prostate cancer screening. Recommendations will vary depending on your family history and other risks.  Hepatitis C blood test.  Hepatitis B blood test.  Sexually transmitted disease (STD) testing.  Diabetes screening. This is done by checking your blood sugar (glucose) after you have not eaten for a while (fasting). You may have this done every 1-3 years.  Abdominal aortic aneurysm (AAA) screening. You may need this if you are a current or former smoker.  Osteoporosis. You may be screened starting at age 33 if you are at high risk. Talk with your health  care provider about your test results, treatment options, and if necessary, the need for more tests. Vaccines  Your health care provider may recommend certain vaccines, such as:  Influenza vaccine. This is recommended every year.  Tetanus, diphtheria, and  acellular pertussis (Tdap, Td) vaccine. You may need a Td booster every 10 years.  Zoster vaccine. You may need this after age 16.  Pneumococcal 13-valent conjugate (PCV13) vaccine. One dose is recommended after age 35.  Pneumococcal polysaccharide (PPSV23) vaccine. One dose is recommended after age 65. Talk to your health care provider about which screenings and vaccines you need and how often you need them. This information is not intended to replace advice given to you by your health care provider. Make sure you discuss any questions you have with your health care provider. Document Released: 02/09/2015 Document Revised: 10/03/2015 Document Reviewed: 11/14/2014 Elsevier Interactive Patient Education  2017 [employer] Prevention in the Home Falls can cause injuries. They can happen to people of all ages. There are many things you can do to make your home safe and to help prevent falls. What can I do on the outside of my home?  Regularly fix the edges of walkways and driveways and fix any cracks.  Remove anything that might make you trip as you walk through a door, such as a raised step or threshold.  Trim any bushes or trees on the path to your home.  Use bright outdoor lighting.  Clear any walking paths of anything that might make someone trip, such as rocks or tools.  Regularly check to see if handrails are loose or broken. Make sure that both sides of any steps have handrails.  Any raised decks and porches should have guardrails on the edges.  Have any leaves, snow, or ice cleared regularly.  Use sand or salt on walking paths during winter.  Clean up any spills in your garage right away. This includes oil or grease spills. What can I do in the bathroom?  Use night lights.  Install grab bars by the toilet and in the tub and shower. Do not use towel bars as grab bars.  Use non-skid mats or decals in the tub or shower.  If you need to sit down in the shower, use  a plastic, non-slip stool.  Keep the floor dry. Clean up any water that spills on the floor as soon as it happens.  Remove soap buildup in the tub or shower regularly.  Attach bath mats securely with double-sided non-slip rug tape.  Do not have throw rugs and other things on the floor that can make you trip. What can I do in the bedroom?  Use night lights.  Make sure that you have a light by your bed that is easy to reach.  Do not use any sheets or blankets that are too big for your bed. They should not hang down onto the floor.  Have a firm chair that has side arms. You can use this for support while you get dressed.  Do not have throw rugs and other things on the floor that can make you trip. What can I do in the kitchen?  Clean up any spills right away.  Avoid walking on wet floors.  Keep items that you use a lot in easy-to-reach places.  If you need to reach something above you, use a strong step stool that has a grab bar.  Keep electrical cords out of the way.  Do not use floor  polish or wax that makes floors slippery. If you must use wax, use non-skid floor wax.  Do not have throw rugs and other things on the floor that can make you trip. What can I do with my stairs?  Do not leave any items on the stairs.  Make sure that there are handrails on both sides of the stairs and use them. Fix handrails that are broken or loose. Make sure that handrails are as long as the stairways.  Check any carpeting to make sure that it is firmly attached to the stairs. Fix any carpet that is loose or worn.  Avoid having throw rugs at the top or bottom of the stairs. If you do have throw rugs, attach them to the floor with carpet tape.  Make sure that you have a light switch at the top of the stairs and the bottom of the stairs. If you do not have them, ask someone to add them for you. What else can I do to help prevent falls?  Wear shoes that:  Do not have high heels.  Have  rubber bottoms.  Are comfortable and fit you well.  Are closed at the toe. Do not wear sandals.  If you use a stepladder:  Make sure that it is fully opened. Do not climb a closed stepladder.  Make sure that both sides of the stepladder are locked into place.  Ask someone to hold it for you, if possible.  Clearly mark and make sure that you can see:  Any grab bars or handrails.  First and last steps.  Where the edge of each step is.  Use tools that help you move around (mobility aids) if they are needed. These include:  Canes.  Walkers.  Scooters.  Crutches.  Turn on the lights when you go into a dark area. Replace any light bulbs as soon as they burn out.  Set up your furniture so you have a clear path. Avoid moving your furniture around.  If any of your floors are uneven, fix them.  If there are any pets around you, be aware of where they are.  Review your medicines with your doctor. Some medicines can make you feel dizzy. This can increase your chance of falling. Ask your doctor what other things that you can do to help prevent falls. This information is not intended to replace advice given to you by your health care provider. Make sure you discuss any questions you have with your health care provider. Document Released: 11/09/2008 Document Revised: 06/21/2015 Document Reviewed: 02/17/2014 Elsevier Interactive Patient Education  2017 [employer].

## 2019-09-07 NOTE — Progress Notes (Signed)
Subjective:   Christopher Browning is a 69 y.o. male who presents for Medicare Annual/Subsequent preventive examination.  Virtual Visit via Telephone Note  I connected with  Christopher Browning on 09/07/19 at  8:40 AM EDT by telephone and verified that I am speaking with the correct person using two identifiers.  Medicare Annual Wellness visit completed telephonically due to Covid-19 pandemic.   Location: Patient: home  Provider: [facility]   I discussed the limitations, risks, security and privacy concerns of performing an evaluation and management service by telephone and the availability of in person appointments. The patient expressed understanding and agreed to proceed.  Unable to perform video visit due to video visit attempted and failed and/or patient does not have video capability.   Some vital signs may be absent or patient reported.   Reather Littler, LPN    Review of Systems     Cardiac Risk Factors include: advanced age (>80men, >62 women);dyslipidemia;male gender;hypertension     Objective:    There were no vitals filed for this visit. There is no height or weight on file to calculate BMI.  Advanced Directives 09/07/2019 04/22/2019 11/29/2014  Does Patient Have a Medical Advance Directive? No No Yes  Type of Advance Directive - - Living will  Does patient want to make changes to medical advance directive? Yes (MAU/Ambulatory/Procedural Areas - Information given) - -  Copy of Healthcare Power of Attorney in Chart? - - No - copy requested  Would patient like information on creating a medical advance directive? - No - Patient declined -    Current Medications (verified) Outpatient Encounter Medications as of 09/07/2019  Medication Sig  . amLODipine (NORVASC) 10 MG tablet Take 1 tablet (10 mg total) by mouth daily.  Marland Kitchen aspirin EC 81 MG tablet Take 1 tablet (81 mg total) by mouth daily.  Marland Kitchen atorvastatin (LIPITOR) 10 MG tablet Take 1 tablet (10 mg total) by mouth daily.  . cetirizine  (ZYRTEC) 10 MG tablet Take 10 mg by mouth daily.  . hydrochlorothiazide (HYDRODIURIL) 25 MG tablet Take 1 tablet (25 mg total) by mouth daily.  . metoprolol succinate (TOPROL-XL) 50 MG 24 hr tablet Take 1 tablet (50 mg total) by mouth daily. Take with or immediately following a meal.  . potassium chloride SA (KLOR-CON) 20 MEQ tablet Take 1 tablet by mouth daily.  . [DISCONTINUED] Potassium Chloride ER 20 MEQ TBCR Take 1 tablet by mouth daily.   No facility-administered encounter medications on file as of 09/07/2019.    Allergies (verified) Patient has no known allergies.   History: Past Medical History:  Diagnosis Date  . Hyperlipidemia   . Hypertension    Past Surgical History:  Procedure Laterality Date  . COLONOSCOPY  2011   cleared for 5 yrs-   History reviewed. No pertinent family history. Social History   Socioeconomic History  . Marital status: Married    Spouse name: Not on file  . Number of children: Not on file  . Years of education: Not on file  . Highest education level: Not on file  Occupational History  . Not on file  Tobacco Use  . Smoking status: Never Smoker  . Smokeless tobacco: Never Used  Substance and Sexual Activity  . Alcohol use: No    Alcohol/week: 0.0 standard drinks  . Drug use: No  . Sexual activity: Not on file  Other Topics Concern  . Not on file  Social History Narrative  . Not on file   Social  Determinants of Health   Financial Resource Strain: Low Risk   . Difficulty of Paying Living Expenses: Not hard at all  Food Insecurity: No Food Insecurity  . Worried About Programme researcher, broadcasting/film/video in the Last Year: Never true  . Ran Out of Food in the Last Year: Never true  Transportation Needs: No Transportation Needs  . Lack of Transportation (Medical): No  . Lack of Transportation (Non-Medical): No  Physical Activity: Sufficiently Active  . Days of Exercise per Week: 3 days  . Minutes of Exercise per Session: 60 min  Stress: No Stress  Concern Present  . Feeling of Stress : Not at all  Social Connections: Moderately Isolated  . Frequency of Communication with Friends and Family: More than three times a week  . Frequency of Social Gatherings with Friends and Family: Three times a week  . Attends Religious Services: Never  . Active Member of Clubs or Organizations: No  . Attends Banker Meetings: Never  . Marital Status: Married    Tobacco Counseling Counseling given: Not Answered   Clinical Intake:  Pre-visit preparation completed: Yes  Pain : No/denies pain     Nutritional Risks: None Diabetes: No  How often do you need to have someone help you when you read instructions, pamphlets, or other written materials from your doctor or pharmacy?: 1 - Never    Interpreter Needed?: No  Information entered by :: Reather Littler LPN   Activities of Daily Living In your present state of health, do you have any difficulty performing the following activities: 09/07/2019  Hearing? N  Comment declines hearing aids  Vision? Y  Comment referral to eye dr today  Difficulty concentrating or making decisions? N  Walking or climbing stairs? N  Dressing or bathing? N  Doing errands, shopping? N  Preparing Food and eating ? N  Using the Toilet? N  In the past six months, have you accidently leaked urine? N  Do you have problems with loss of bowel control? N  Managing your Medications? N  Managing your Finances? N  Housekeeping or managing your Housekeeping? N  Some recent data might be hidden    Patient Care Team: Duanne Limerick, MD as PCP - General (Family Medicine)  Indicate any recent Medical Services you may have received from other than [facility] providers in the past year (date may be approximate).     Assessment:   This is a routine wellness examination for Christopher Browning.  Hearing/Vision screen  Hearing Screening   125Hz  250Hz  500Hz  1000Hz  2000Hz  3000Hz  4000Hz  6000Hz  8000Hz   Right ear:           Left  ear:           Comments: Pt denies hearing difficulty  Vision Screening Comments: Past due for eye exam; referral sent to [facility] today   Dietary issues and exercise activities discussed: Current Exercise Habits: Home exercise routine, Type of exercise: Other - see comments (outside work), Time (Minutes): 60, Frequency (Times/Week): 3, Weekly Exercise (Minutes/Week): 180, Intensity: Moderate, Exercise limited by: None identified  Goals    . DIET - INCREASE WATER INTAKE     Recommend drinking 6-8 glasses of water per day      Depression Screen PHQ 2/9 Scores 09/07/2019 07/29/2019 04/25/2019 04/18/2019 03/23/2019 01/31/2019 08/10/2018  PHQ - 2 Score 0 0 0 0 0 0 0  PHQ- 9 Score - 0 0 0 0 0 0    Fall Risk Fall Risk  09/07/2019 07/29/2019 04/25/2019 04/18/2019 03/23/2019  Falls in the past year? 0 0 0 0 0  Number falls in past yr: 0 - - 0 -  Injury with Fall? 0 - - 0 -  Risk for fall due to : Impaired vision - Impaired balance/gait No Fall Risks -  Follow up Falls prevention discussed Falls evaluation completed Falls evaluation completed Falls evaluation completed Falls evaluation completed    Any stairs in or around the home? Yes  If so, are there any without handrails? No  Home free of loose throw rugs in walkways, pet beds, electrical cords, etc? Yes  Adequate lighting in your home to reduce risk of falls? Yes   ASSISTIVE DEVICES UTILIZED TO PREVENT FALLS:  Life alert? No  Use of a cane, walker or w/c? No  Grab bars in the bathroom? No  Shower chair or bench in shower? No  Elevated toilet seat or a handicapped toilet? No   TIMED UP AND GO:  Was the test performed? No . Telephonic visit.   Cognitive Function: pt declined 6CIT for 2021 AWV        Immunizations Immunization History  Administered Date(s) Administered  . Influenza, High Dose Seasonal PF 11/04/2017  . Influenza,inj,Quad PF,6+ Mos 10/03/2016, 10/28/2018  . Moderna SARS-COVID-2 Vaccination 03/11/2019,  04/08/2019  . Pneumococcal Conjugate-13 08/04/2017  . Pneumococcal Polysaccharide-23 01/31/2019  . Tdap 11/29/2015    TDAP status: Up to date   Flu Vaccine status: Up to date   Pneumococcal vaccine status: Up to date   Covid-19 vaccine status: Completed vaccines  Qualifies for Shingles Vaccine? Yes   Zostavax completed No   Shingrix Completed?: No.    Education has been provided regarding the importance of this vaccine. Patient has been advised to call insurance company to determine out of pocket expense if they have not yet received this vaccine. Advised may also receive vaccine at local pharmacy or Health Dept. Verbalized acceptance and understanding.  Screening Tests Health Maintenance  Topic Date Due  . INFLUENZA VACCINE  08/28/2019  . COLONOSCOPY  11/28/2025  . TETANUS/TDAP  11/28/2025  . COVID-19 Vaccine  Completed  . Hepatitis C Screening  Completed  . PNA vac Low Risk Adult  Completed    Health Maintenance  Health Maintenance Due  Topic Date Due  . INFLUENZA VACCINE  08/28/2019   Colorectal cancer screening: pt declines. FOBT completed 08/03/19.  Lung Cancer Screening: (Low Dose CT Chest recommended if Age 28-80 years, 30 pack-year currently smoking OR have quit w/in 15years.) does not qualify.   Additional Screening:  Hepatitis C Screening: does qualify; Completed 02/04/17  Vision Screening: Recommended annual ophthalmology exams for early detection of glaucoma and other disorders of the eye. Is the patient up to date with their annual eye exam?  No  Who is the provider or what is the name of the office in which the patient attends annual eye exams? Not established If pt is not established with a provider, would they like to be referred to a provider to establish care? Yes .   Dental Screening: Recommended annual dental exams for proper oral hygiene  Community Resource Referral / Chronic Care Management: CRR required this visit?  No   CCM required this visit?   No      Plan:     I have personally reviewed and noted the following in the patient's chart:   . Medical and social history . Use of alcohol, tobacco or illicit drugs  . Current medications  and supplements . Functional ability and status . Nutritional status . Physical activity . Advanced directives . List of other physicians . Hospitalizations, surgeries, and ER visits in previous 12 months . Vitals . Screenings to include cognitive, depression, and falls . Referrals and appointments  In addition, I have reviewed and discussed with patient certain preventive protocols, quality metrics, and best practice recommendations. A written personalized care plan for preventive services as well as general preventive health recommendations were provided to patient.     Reather LittlerKasey Marilin Kofman, LPN   9/14/78298/11/2019   Nurse Notes: pt doing well and appreciative of visit today

## 2019-09-12 ENCOUNTER — Other Ambulatory Visit: Payer: Self-pay | Admitting: Family Medicine

## 2019-09-12 DIAGNOSIS — I10 Essential (primary) hypertension: Secondary | ICD-10-CM

## 2019-09-14 ENCOUNTER — Other Ambulatory Visit: Payer: Self-pay

## 2019-09-14 DIAGNOSIS — I10 Essential (primary) hypertension: Secondary | ICD-10-CM

## 2019-09-14 MED ORDER — HYDROCHLOROTHIAZIDE 25 MG PO TABS: 25.0000 mg | ORAL_TABLET | Freq: Every day | ORAL | 1 refills | Status: DC

## 2019-10-07 DIAGNOSIS — H2513 Age-related nuclear cataract, bilateral: Secondary | ICD-10-CM | POA: Diagnosis not present

## 2019-10-26 ENCOUNTER — Ambulatory Visit: Payer: Medicare PPO | Admitting: Family Medicine

## 2020-01-16 ENCOUNTER — Telehealth: Payer: Self-pay

## 2020-01-16 NOTE — Telephone Encounter (Signed)
Working on end of year metrics. Patients last BP in July was 150/80. He needs to come in by the end of next week to follow up in his BP. He is scheduled for Jan 4th but this is too late to follow up for this.   Please call patient and change his January 4th appt to a sooner date before the end of year with Dr Yetta Barre.  Thank you.

## 2020-01-18 ENCOUNTER — Ambulatory Visit: Payer: Medicare PPO | Admitting: Family Medicine

## 2020-01-18 ENCOUNTER — Other Ambulatory Visit: Payer: Self-pay

## 2020-01-18 ENCOUNTER — Encounter: Payer: Self-pay | Admitting: Family Medicine

## 2020-01-18 VITALS — BP 138/80 | HR 76 | Ht 68.0 in | Wt 155.0 lb

## 2020-01-18 DIAGNOSIS — I10 Essential (primary) hypertension: Secondary | ICD-10-CM | POA: Diagnosis not present

## 2020-01-18 MED ORDER — METOPROLOL SUCCINATE ER 50 MG PO TB24: 50.0000 mg | ORAL_TABLET | Freq: Every day | ORAL | 1 refills | Status: DC

## 2020-01-18 MED ORDER — HYDROCHLOROTHIAZIDE 25 MG PO TABS: 25.0000 mg | ORAL_TABLET | Freq: Every day | ORAL | 1 refills | Status: DC

## 2020-01-18 MED ORDER — AMLODIPINE BESYLATE 10 MG PO TABS: 10.0000 mg | ORAL_TABLET | Freq: Every day | ORAL | 1 refills | Status: DC

## 2020-01-18 NOTE — Progress Notes (Signed)
Date:  01/18/2020   Name:  Christopher Browning   DOB:  1950-10-17   MRN:  [id number]   Chief Complaint: Hypertension  Hypertension This is a chronic problem. The current episode started more than 1 year ago. The problem has been waxing and waning since onset. The problem is uncontrolled. Pertinent negatives include no anxiety, blurred vision, chest pain, headaches, malaise/fatigue, neck pain, orthopnea, palpitations, peripheral edema, PND, shortness of breath or sweats. There are no associated agents to hypertension. Past treatments include calcium channel blockers, beta blockers and diuretics. The current treatment provides moderate improvement. There are no compliance problems.  There is no history of angina, kidney disease, CAD/MI, CVA, heart failure, left ventricular hypertrophy or PVD. There is no history of chronic renal disease, a hypertension causing med or renovascular disease.    Lab Results  Component Value Date   CREATININE 1.14 04/25/2019   BUN 12 04/25/2019   NA 134 04/25/2019   K 3.5 04/25/2019   CL 91 (L) 04/25/2019   CO2 27 04/25/2019   Lab Results  Component Value Date   CHOL 128 07/29/2019   HDL 48 07/29/2019   LDLCALC 67 07/29/2019   TRIG 58 07/29/2019   CHOLHDL 2.8 08/04/2017   No results found for: TSH Lab Results  Component Value Date   HGBA1C 5.6 07/29/2019   Lab Results  Component Value Date   WBC 11.9 (H) 04/22/2019   HGB 15.5 04/22/2019   HCT 44.5 04/22/2019   MCV 85.9 04/22/2019   PLT 390 04/22/2019   Lab Results  Component Value Date   ALT 27 04/22/2019   AST 24 04/22/2019   ALKPHOS 47 04/22/2019   BILITOT 1.2 04/22/2019     Review of Systems  Constitutional: Negative for chills, fever and malaise/fatigue.  HENT: Negative for drooling, ear discharge, ear pain and sore throat.   Eyes: Negative for blurred vision.  Respiratory: Negative for cough, shortness of breath and wheezing.   Cardiovascular: Negative for chest pain, palpitations,  orthopnea, leg swelling and PND.  Gastrointestinal: Negative for abdominal pain, blood in stool, constipation, diarrhea and nausea.  Endocrine: Negative for polydipsia.  Genitourinary: Negative for dysuria, frequency, hematuria and urgency.  Musculoskeletal: Negative for back pain, myalgias and neck pain.  Skin: Negative for rash.  Allergic/Immunologic: Negative for environmental allergies.  Neurological: Negative for dizziness and headaches.  Hematological: Does not bruise/bleed easily.  Psychiatric/Behavioral: Negative for suicidal ideas. The patient is not nervous/anxious.     Patient Active Problem List   Diagnosis Date Noted  . Hypokalemia 07/29/2019  . Lumbar radiculopathy 04/18/2019  . Aortic atherosclerosis (HCC) 04/18/2019  . Atherosclerosis of both carotid arteries 02/04/2017  . Cerebral vascular disease 02/04/2017  . History of atrial fibrillation 02/04/2017  . 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 Pure hypercholesterolemia 09/11/2016  . Essential hypertension 11/29/2014    No Known Allergies  Past Surgical History:  Procedure Laterality Date  . COLONOSCOPY  2011   cleared for 5 yrs-    Social History   Tobacco Use  . Smoking status: Never Smoker  . Smokeless tobacco: Never Used  Substance Use Topics  . Alcohol use: No    Alcohol/week: 0.0 standard drinks  . Drug use: No     Medication list has been reviewed and updated.  Current Meds  Medication Sig  . amLODipine (NORVASC) 10 MG tablet Take 1 tablet (10 mg total) by mouth daily.  Marland Kitchen aspirin EC 81 MG tablet Take 1  tablet (81 mg total) by mouth daily.  Marland Kitchen atorvastatin (LIPITOR) 10 MG tablet Take 1 tablet (10 mg total) by mouth daily.  . cetirizine (ZYRTEC) 10 MG tablet Take 10 mg by mouth daily.  . hydrochlorothiazide (HYDRODIURIL) 25 MG tablet Take 1 tablet (25 mg total) by mouth daily.  . metoprolol succinate (TOPROL-XL) 50 MG 24 hr tablet Take 1 tablet (50 mg total) by mouth daily.  Take with or immediately following a meal.  . potassium chloride SA (KLOR-CON) 20 MEQ tablet Take 1 tablet by mouth daily.    PHQ 2/9 Scores 01/18/2020 09/07/2019 07/29/2019 04/25/2019  PHQ - 2 Score 0 0 0 0  PHQ- 9 Score 0 - 0 0    GAD 7 : Generalized Anxiety Score 07/29/2019 04/25/2019 04/18/2019 03/23/2019  Nervous, Anxious, on Edge 0 0 0 0  Control/stop worrying 0 0 0 0  Worry too much - different things 0 0 0 0  Trouble relaxing 0 0 0 0  Restless 0 0 0 0  Easily annoyed or irritable 0 0 0 0  Afraid - awful might happen 0 0 0 0  Total GAD 7 Score 0 0 0 0  Anxiety Difficulty - - Not difficult at all -    BP Readings from Last 3 Encounters:  01/18/20 (!) 154/80  07/29/19 (!) 150/80  04/25/19 140/90    Physical Exam Vitals and nursing note reviewed.  HENT:     Head: Normocephalic.     Right Ear: External ear normal.     Left Ear: External ear normal.     Nose: Nose normal.     Mouth/Throat:     Mouth: Oropharynx is clear and moist.  Eyes:     General: No scleral icterus.       Right eye: No discharge.        Left eye: No discharge.     Extraocular Movements: EOM normal.     Conjunctiva/sclera: Conjunctivae normal.     Pupils: Pupils are equal, round, and reactive to light.  Neck:     Thyroid: No thyromegaly.     Vascular: No JVD.     Trachea: No tracheal deviation.  Cardiovascular:     Rate and Rhythm: Normal rate and regular rhythm.     Pulses: Intact distal pulses.     Heart sounds: Normal heart sounds, S1 normal and S2 normal. No murmur heard.  No systolic murmur is present.  No diastolic murmur is present. No friction rub. No gallop. No S3 or S4 sounds.   Pulmonary:     Effort: No respiratory distress.     Breath sounds: Normal breath sounds. No wheezing or rales.  Abdominal:     General: Bowel sounds are normal.     Palpations: Abdomen is soft. There is no hepatosplenomegaly or mass.     Tenderness: There is no abdominal tenderness. There is no CVA tenderness,  guarding or rebound.  Musculoskeletal:        General: No tenderness or edema. Normal range of motion.     Cervical back: Normal range of motion and neck supple.     Right lower leg: No edema.     Left lower leg: No edema.  Lymphadenopathy:     Cervical: No cervical adenopathy.  Skin:    General: Skin is warm.     Findings: No rash.  Neurological:     Mental Status: He is alert and oriented to person, place, and time.     Cranial  Nerves: No cranial nerve deficit.     Deep Tendon Reflexes: Strength normal and reflexes are normal and symmetric.     Wt Readings from Last 3 Encounters:  01/18/20 155 lb (70.3 kg)  07/29/19 157 lb (71.2 kg)  04/25/19 164 lb (74.4 kg)    BP (!) 154/80   Pulse 76   Ht 5\' 8"  (1.727 m)   Wt 155 lb (70.3 kg)   BMI 23.57 kg/m   Assessment and Plan:  1. Essential hypertension Chronic.  Controlled.  Stable.  Patient returns for blood pressure check with adjustment of medication last visit.  Blood pressure in a supine position allowing the arm to be totally relaxed is registered at 137-138/80.  We will continue present regimen of Toprol-XL 50 mg, hydrochlorothiazide 25 mg, and amlodipine 10 mg once a day.  Patient's been instructed to continue with low-sodium diet.  Patient will return in 4 months of recheck of blood pressure reading. - metoprolol succinate (TOPROL-XL) 50 MG 24 hr tablet; Take 1 tablet (50 mg total) by mouth daily. Take with or immediately following a meal.  Dispense: 90 tablet; Refill: 1 - hydrochlorothiazide (HYDRODIURIL) 25 MG tablet; Take 1 tablet (25 mg total) by mouth daily.  Dispense: 90 tablet; Refill: 1 - amLODipine (NORVASC) 10 MG tablet; Take 1 tablet (10 mg total) by mouth daily.  Dispense: 90 tablet; Refill: 1

## 2020-01-18 NOTE — Patient Instructions (Signed)
How to Take Your Blood Pressure You can take your blood pressure at home with a machine. You may need to check your blood pressure at home:  To check if you have high blood pressure (hypertension).  To check your blood pressure over time.  To make sure your blood pressure medicine is working. Supplies needed: You will need a blood pressure machine, or monitor. You can buy one at a drugstore or online. When choosing one:  Choose one with an arm cuff.  Choose one that wraps around your upper arm. Only one finger should fit between your arm and the cuff.  Do not choose one that measures your blood pressure from your wrist or finger. Your doctor can suggest a monitor. How to prepare Avoid these things for 30 minutes before checking your blood pressure:  Drinking caffeine.  Drinking alcohol.  Eating.  Smoking.  Exercising. Five minutes before checking your blood pressure:  Pee.  Sit in a dining chair. Avoid sitting in a soft couch or armchair.  Be quiet. Do not talk. How to take your blood pressure Follow the instructions that came with your machine. If you have a digital blood pressure monitor, these may be the instructions: 1. Sit up straight. 2. Place your feet on the floor. Do not cross your ankles or legs. 3. Rest your left arm at the level of your heart. You may rest it on a table, desk, or chair. 4. Pull up your shirt sleeve. 5. Wrap the blood pressure cuff around the upper part of your left arm. The cuff should be 1 inch (2.5 cm) above your elbow. It is best to wrap the cuff around bare skin. 6. Fit the cuff snugly around your arm. You should be able to place only one finger between the cuff and your arm. 7. Put the cord inside the groove of your elbow. 8. Press the power button. 9. Sit quietly while the cuff fills with air and loses air. 10. Write down the numbers on the screen. 11. Wait 2-3 minutes and then repeat steps 1-10. What do the numbers mean? Two  numbers make up your blood pressure. The first number is called systolic pressure. The second is called diastolic pressure. An example of a blood pressure reading is "120 over 80" (or 120/80). If you are an adult and do not have a medical condition, use this guide to find out if your blood pressure is normal: Normal  First number: below 120.  Second number: below 80. Elevated  First number: 120-129.  Second number: below 80. Hypertension stage 1  First number: 130-139.  Second number: 80-89. Hypertension stage 2  First number: 140 or above.  Second number: 90 or above. Your blood pressure is above normal even if only the top or bottom number is above normal. Follow these instructions at home:  Check your blood pressure as often as your doctor tells you to.  Take your monitor to your next doctor's appointment. Your doctor will: ? Make sure you are using it correctly. ? Make sure it is working right.  Make sure you understand what your blood pressure numbers should be.  Tell your doctor if your medicines are causing side effects. Contact a doctor if:  Your blood pressure keeps being high. Get help right away if:  Your first blood pressure number is higher than 180.  Your second blood pressure number is higher than 120. This information is not intended to replace advice given to you by your health   care provider. Make sure you discuss any questions you have with your health care provider. Document Revised: 12/26/2016 Document Reviewed: 06/22/2015 Elsevier Patient Education  2020 [employer]  

## 2020-01-30 ENCOUNTER — Other Ambulatory Visit: Payer: Self-pay | Admitting: Family Medicine

## 2020-01-30 NOTE — Telephone Encounter (Signed)
Requested medication (s) are due for refill today - no  Requested medication (s) are on the active medication list -no  Future visit scheduled -yes  Last refill: 10/28/2017  Notes to clinic: Request medication no longer on current medication list  Requested Prescriptions  Pending Prescriptions Disp Refills   Potassium Chloride ER 20 MEQ TBCR [Pharmacy Med Name: Potassium Chloride ER 20 MEQ Oral Tablet Extended Release] 90 tablet 0    Sig: Take 1 tablet by mouth once daily      Endocrinology:  Minerals - Potassium Supplementation Passed - 01/30/2020  1:35 PM      Passed - K in normal range and within 360 days    Potassium  Date Value Ref Range Status  04/25/2019 3.5 3.5 - 5.2 mmol/L Final          Passed - Cr in normal range and within 360 days    Creatinine, Ser  Date Value Ref Range Status  04/25/2019 1.14 0.76 - 1.27 mg/dL Final          Passed - Valid encounter within last 12 months    Recent Outpatient Visits           1 week ago Essential hypertension   [facility] Duanne Limerick, MD   6 months ago Essential hypertension   [facility] Duanne Limerick, MD   9 months ago Essential hypertension   [facility] Duanne Limerick, MD   9 months ago Tachycardia   [facility] Duanne Limerick, MD   10 months ago Iliotibial band tendinitis of right side   [facility] Duanne Limerick, MD       Future Appointments             In 3 months Duanne Limerick, MD [facility], [facility]                 Requested Prescriptions  Pending Prescriptions Disp Refills   Potassium Chloride ER 20 MEQ TBCR [Pharmacy Med Name: Potassium Chloride ER 20 MEQ Oral Tablet Extended Release] 90 tablet 0    Sig: Take 1 tablet by mouth once daily      Endocrinology:  Minerals - Potassium Supplementation Passed - 01/30/2020  1:35 PM      Passed - K in normal range and within 360 days    Potassium  Date Value Ref Range Status   04/25/2019 3.5 3.5 - 5.2 mmol/L Final          Passed - Cr in normal range and within 360 days    Creatinine, Ser  Date Value Ref Range Status  04/25/2019 1.14 0.76 - 1.27 mg/dL Final          Passed - Valid encounter within last 12 months    Recent Outpatient Visits           1 week ago Essential hypertension   [facility] Duanne Limerick, MD   6 months ago Essential hypertension   [facility] Duanne Limerick, MD   9 months ago Essential hypertension   [facility] Duanne Limerick, MD   9 months ago Tachycardia   [facility] Duanne Limerick, MD   10 months ago Iliotibial band tendinitis of right side   [facility] Duanne Limerick, MD       Future Appointments             In 3 months  Duanne Limerick, MD [facility], [facility]

## 2020-01-31 ENCOUNTER — Ambulatory Visit: Payer: Medicare PPO | Admitting: Family Medicine

## 2020-02-06 ENCOUNTER — Other Ambulatory Visit: Payer: Self-pay | Admitting: Family Medicine

## 2020-02-06 DIAGNOSIS — I10 Essential (primary) hypertension: Secondary | ICD-10-CM

## 2020-04-16 ENCOUNTER — Other Ambulatory Visit: Payer: Self-pay | Admitting: Family Medicine

## 2020-04-16 DIAGNOSIS — E7800 Pure hypercholesterolemia, unspecified: Secondary | ICD-10-CM

## 2020-04-30 ENCOUNTER — Other Ambulatory Visit: Payer: Self-pay | Admitting: Family Medicine

## 2020-04-30 NOTE — Telephone Encounter (Signed)
Requested medication (s) are due for refill today: yes  Requested medication (s) are on the active medication list: yes  Last refill:  01/30/20  Future visit scheduled: yes  Notes to clinic:  overdue lab work   Requested Prescriptions  Pending Prescriptions Disp Refills   Potassium Chloride ER 20 MEQ TBCR [Pharmacy Med Name: Potassium Chloride ER 20 MEQ Oral Tablet Extended Release] 90 tablet 0    Sig: Take 1 tablet by mouth once daily      Endocrinology:  Minerals - Potassium Supplementation Failed - 04/30/2020  9:34 AM      Failed - K in normal range and within 360 days    Potassium  Date Value Ref Range Status  04/25/2019 3.5 3.5 - 5.2 mmol/L Final          Failed - Cr in normal range and within 360 days    Creatinine, Ser  Date Value Ref Range Status  04/25/2019 1.14 0.76 - 1.27 mg/dL Final          Passed - Valid encounter within last 12 months    Recent Outpatient Visits           3 months ago Essential hypertension   [facility] Duanne Limerick, MD   9 months ago Essential hypertension   [facility] Duanne Limerick, MD   1 year ago Essential hypertension   [facility] Duanne Limerick, MD   1 year ago Tachycardia   [facility] Duanne Limerick, MD   1 year ago Iliotibial band tendinitis of right side   [facility] Duanne Limerick, MD       Future Appointments             In 2 weeks Duanne Limerick, MD [facility], [facility]

## 2020-05-07 ENCOUNTER — Other Ambulatory Visit: Payer: Self-pay | Admitting: Family Medicine

## 2020-05-07 DIAGNOSIS — I10 Essential (primary) hypertension: Secondary | ICD-10-CM

## 2020-05-18 ENCOUNTER — Encounter: Payer: Self-pay | Admitting: Family Medicine

## 2020-05-18 ENCOUNTER — Ambulatory Visit: Payer: Medicare PPO | Admitting: Family Medicine

## 2020-05-18 ENCOUNTER — Other Ambulatory Visit: Payer: Self-pay

## 2020-05-18 VITALS — BP 130/70 | HR 72 | Ht 68.0 in | Wt 153.0 lb

## 2020-05-18 DIAGNOSIS — E876 Hypokalemia: Secondary | ICD-10-CM | POA: Diagnosis not present

## 2020-05-18 DIAGNOSIS — I10 Essential (primary) hypertension: Secondary | ICD-10-CM

## 2020-05-18 DIAGNOSIS — I700 Atherosclerosis of aorta: Secondary | ICD-10-CM | POA: Diagnosis not present

## 2020-05-18 DIAGNOSIS — E7800 Pure hypercholesterolemia, unspecified: Secondary | ICD-10-CM

## 2020-05-18 MED ORDER — ATORVASTATIN CALCIUM 10 MG PO TABS: 1.0000 | ORAL_TABLET | Freq: Every day | ORAL | 1 refills | Status: DC

## 2020-05-18 MED ORDER — METOPROLOL SUCCINATE ER 50 MG PO TB24: 50.0000 mg | ORAL_TABLET | Freq: Every day | ORAL | 1 refills | Status: DC

## 2020-05-18 MED ORDER — POTASSIUM CHLORIDE ER 20 MEQ PO TBCR: 1.0000 | EXTENDED_RELEASE_TABLET | Freq: Every day | ORAL | 1 refills | Status: DC

## 2020-05-18 MED ORDER — AMLODIPINE BESYLATE 10 MG PO TABS: 1.0000 | ORAL_TABLET | Freq: Every day | ORAL | 1 refills | Status: DC

## 2020-05-18 MED ORDER — HYDROCHLOROTHIAZIDE 25 MG PO TABS: 25.0000 mg | ORAL_TABLET | Freq: Every day | ORAL | 1 refills | Status: DC

## 2020-05-18 NOTE — Progress Notes (Signed)
Date:  05/18/2020   Name:  Christopher Browning   DOB:  [birth year]   MRN:  [id number]   Chief Complaint: Hypertension, Hyperlipidemia, and hypokalemia  Hypertension This is a chronic problem. The current episode started more than 1 year ago. The problem has been gradually improving since onset. The problem is controlled. Pertinent negatives include no anxiety, blurred vision, chest pain, headaches, malaise/fatigue, neck pain, orthopnea, palpitations, peripheral edema, PND, shortness of breath or sweats. There are no associated agents to hypertension. There are no known risk factors for coronary artery disease. Past treatments include beta blockers, calcium channel blockers and diuretics. The current treatment provides moderate improvement. There are no compliance problems.  There is no history of angina, kidney disease, CAD/MI, CVA, heart failure, left ventricular hypertrophy, PVD or retinopathy. There is no history of chronic renal disease, a hypertension causing med or renovascular disease.  Hyperlipidemia The current episode started more than 1 year ago. The problem is controlled. He has no history of chronic renal disease. Pertinent negatives include no chest pain, focal sensory loss, focal weakness, leg pain, myalgias or shortness of breath. Current antihyperlipidemic treatment includes statins. The current treatment provides moderate improvement of lipids. There are no compliance problems.     Lab Results  Component Value Date   CREATININE 1.14 04/25/2019   BUN 12 04/25/2019   NA 134 04/25/2019   K 3.5 04/25/2019   CL 91 (L) 04/25/2019   CO2 27 04/25/2019   Lab Results  Component Value Date   CHOL 128 07/29/2019   HDL 48 07/29/2019   LDLCALC 67 07/29/2019   TRIG 58 07/29/2019   CHOLHDL 2.8 08/04/2017   No results found for: TSH Lab Results  Component Value Date   HGBA1C 5.6 07/29/2019   Lab Results  Component Value Date   WBC 11.9 (H) 04/22/2019   HGB 15.5 04/22/2019   HCT  44.5 04/22/2019   MCV 85.9 04/22/2019   PLT 390 04/22/2019   Lab Results  Component Value Date   ALT 27 04/22/2019   AST 24 04/22/2019   ALKPHOS 47 04/22/2019   BILITOT 1.2 04/22/2019     Review of Systems  Constitutional: Negative for chills, fever and malaise/fatigue.  HENT: Negative for drooling, ear discharge, ear pain and sore throat.   Eyes: Negative for blurred vision.  Respiratory: Negative for cough, shortness of breath and wheezing.   Cardiovascular: Negative for chest pain, palpitations, orthopnea, leg swelling and PND.  Gastrointestinal: Negative for abdominal pain, blood in stool, constipation, diarrhea and nausea.  Endocrine: Negative for polydipsia.  Genitourinary: Negative for dysuria, frequency, hematuria and urgency.  Musculoskeletal: Negative for back pain, myalgias and neck pain.  Skin: Negative for rash.  Allergic/Immunologic: Negative for environmental allergies.  Neurological: Negative for dizziness, focal weakness and headaches.  Hematological: Does not bruise/bleed easily.  Psychiatric/Behavioral: Negative for suicidal ideas. The patient is not nervous/anxious.     Patient Active Problem List   Diagnosis Date Noted  . Hypokalemia 07/29/2019  . Lumbar radiculopathy 04/18/2019  . Aortic atherosclerosis (HCC) 04/18/2019  . Atherosclerosis of both carotid arteries 02/04/2017  . Cerebral vascular disease 02/04/2017  . History of atrial fibrillation 02/04/2017  . 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 Pure hypercholesterolemia 09/11/2016  . Essential hypertension 11/29/2014    No Known Allergies  Past Surgical History:  Procedure Laterality Date  . COLONOSCOPY  2011   cleared for 5 yrs-    Social History   Tobacco Use  .  Smoking status: Never Smoker  . Smokeless tobacco: Never Used  Substance Use Topics  . Alcohol use: No    Alcohol/week: 0.0 standard drinks  . Drug use: No     Medication list has been reviewed and  updated.  Current Meds  Medication Sig  . amLODipine (NORVASC) 10 MG tablet Take 1 tablet by mouth once daily  . aspirin EC 81 MG tablet Take 1 tablet (81 mg total) by mouth daily.  Marland Kitchen atorvastatin (LIPITOR) 10 MG tablet Take 1 tablet by mouth once daily  . cetirizine (ZYRTEC) 10 MG tablet Take 10 mg by mouth daily.  . hydrochlorothiazide (HYDRODIURIL) 25 MG tablet Take 1 tablet (25 mg total) by mouth daily.  . metoprolol succinate (TOPROL-XL) 50 MG 24 hr tablet Take 1 tablet (50 mg total) by mouth daily. Take with or immediately following a meal.  . Potassium Chloride ER 20 MEQ TBCR Take 1 tablet by mouth once daily    PHQ 2/9 Scores 05/18/2020 01/18/2020 09/07/2019 07/29/2019  PHQ - 2 Score 0 0 0 0  PHQ- 9 Score 0 0 - 0    GAD 7 : Generalized Anxiety Score 05/18/2020 07/29/2019 04/25/2019 04/18/2019  Nervous, Anxious, on Edge 0 0 0 0  Control/stop worrying 0 0 0 0  Worry too much - different things 0 0 0 0  Trouble relaxing 0 0 0 0  Restless 0 0 0 0  Easily annoyed or irritable 0 0 0 0  Afraid - awful might happen 0 0 0 0  Total GAD 7 Score 0 0 0 0  Anxiety Difficulty - - - Not difficult at all    BP Readings from Last 3 Encounters:  05/18/20 130/70  01/18/20 138/80  07/29/19 (!) 150/80    Physical Exam Vitals and nursing note reviewed.  HENT:     Head: Normocephalic.     Right Ear: Tympanic membrane, ear canal and external ear normal.     Left Ear: Tympanic membrane, ear canal and external ear normal.     Nose: Nose normal.     Mouth/Throat:     Mouth: Mucous membranes are moist.  Eyes:     General: No scleral icterus.       Right eye: No discharge.        Left eye: No discharge.     Conjunctiva/sclera: Conjunctivae normal.     Pupils: Pupils are equal, round, and reactive to light.  Neck:     Thyroid: No thyromegaly.     Vascular: No JVD.     Trachea: No tracheal deviation.  Cardiovascular:     Rate and Rhythm: Normal rate and regular rhythm.     Heart sounds:  Normal heart sounds. No murmur heard. No friction rub. No gallop.   Pulmonary:     Effort: No respiratory distress.     Breath sounds: Normal breath sounds. No wheezing, rhonchi or rales.  Abdominal:     General: Bowel sounds are normal.     Palpations: Abdomen is soft. There is no mass.     Tenderness: There is no abdominal tenderness. There is no guarding or rebound.  Musculoskeletal:        General: No tenderness. Normal range of motion.     Cervical back: Normal range of motion and neck supple.  Lymphadenopathy:     Cervical: No cervical adenopathy.  Skin:    General: Skin is warm.     Findings: No erythema or rash.  Neurological:  Mental Status: He is alert and oriented to person, place, and time.     Cranial Nerves: No cranial nerve deficit.     Deep Tendon Reflexes: Reflexes are normal and symmetric.     Wt Readings from Last 3 Encounters:  05/18/20 153 lb (69.4 kg)  01/18/20 155 lb (70.3 kg)  07/29/19 157 lb (71.2 kg)    BP 130/70   Pulse 72   Ht 5\' 8"  (1.727 m)   Wt 153 lb (69.4 kg)   BMI 23.26 kg/m   Assessment and Plan:  1. Essential hypertension Chronic.  Controlled.  Stable.  Blood pressure today is excellent at 130/70.  We will continue amlodipine 10 mg once a day hydrochlorothiazide 25 mg once a day and Toprol-XL 50 mg once a day.  Will check renal function panel for electrolytes and GFR surveillance for - amLODipine (NORVASC) 10 MG tablet; Take 1 tablet (10 mg total) by mouth daily.  Dispense: 90 tablet; Refill: 1 - hydrochlorothiazide (HYDRODIURIL) 25 MG tablet; Take 1 tablet (25 mg total) by mouth daily.  Dispense: 90 tablet; Refill: 1 - metoprolol succinate (TOPROL-XL) 50 MG 24 hr tablet; Take 1 tablet (50 mg total) by mouth daily. Take with or immediately following a meal.  Dispense: 90 tablet; Refill: 1 - Comprehensive Metabolic Panel (CMET)  2. Pure hypercholesterolemia Chronic.  Controlled.  Stable.  Continue atorvastatin 10 mg once a day.   Will check lipid panel. - atorvastatin (LIPITOR) 10 MG tablet; Take 1 tablet (10 mg total) by mouth daily.  Dispense: 90 tablet; Refill: 1 - Lipid Panel With LDL/HDL Ratio  3. Hypokalemia Currently we are watching hypokalemia and not necessary to supplement at this time.  We will continue potassium KCl ER 20 mEq daily - Comprehensive Metabolic Panel (CMET)  4. Aortic atherosclerosis (HCC) Patient has documented aortic atherosclerosis which we will control with maintaining weight, blood pressure control, and Mediterranean diet given for control of cholesterol.

## 2020-05-18 NOTE — Patient Instructions (Signed)

## 2020-05-19 LAB — COMPREHENSIVE METABOLIC PANEL
ALT: 29 IU/L (ref 0–44)
AST: 26 IU/L (ref 0–40)
Albumin/Globulin Ratio: 2 (ref 1.2–2.2)
Albumin: 4.9 g/dL — ABNORMAL HIGH (ref 3.8–4.8)
Alkaline Phosphatase: 45 IU/L (ref 44–121)
BUN/Creatinine Ratio: 14 (ref 10–24)
BUN: 15 mg/dL (ref 8–27)
Bilirubin Total: 0.6 mg/dL (ref 0.0–1.2)
CO2: 26 mmol/L (ref 20–29)
Calcium: 9.9 mg/dL (ref 8.6–10.2)
Chloride: 98 mmol/L (ref 96–106)
Creatinine, Ser: 1.04 mg/dL (ref 0.76–1.27)
Globulin, Total: 2.5 g/dL (ref 1.5–4.5)
Glucose: 100 mg/dL — ABNORMAL HIGH (ref 65–99)
Potassium: 3.4 mmol/L — ABNORMAL LOW (ref 3.5–5.2)
Sodium: 140 mmol/L (ref 134–144)
Total Protein: 7.4 g/dL (ref 6.0–8.5)
eGFR: 78 mL/min/{1.73_m2} (ref >59)

## 2020-05-19 LAB — LIPID PANEL WITH LDL/HDL RATIO
Cholesterol, Total: 113 mg/dL (ref 100–199)
HDL: 45 mg/dL (ref >39)
LDL Chol Calc (NIH): 57 mg/dL (ref 0–99)
LDL/HDL Ratio: 1.3 ratio (ref 0.0–3.6)
Triglycerides: 42 mg/dL (ref 0–149)
VLDL Cholesterol Cal: 11 mg/dL (ref 5–40)

## 2020-09-10 ENCOUNTER — Ambulatory Visit: Payer: Medicare PPO

## 2020-09-13 ENCOUNTER — Telehealth: Payer: Self-pay | Admitting: Family Medicine

## 2020-09-13 NOTE — Telephone Encounter (Signed)
Copied from CRM [id number]. Topic: Medicare AWV >> Sep 13, 2020 11:09 AM Claudette Laws R wrote: Reason for CRM:  Left message for patient to call back and schedule Medicare Annual Wellness Visit (AWV) in office.   If unable to come into the office for AWV,  please offer to do virtually or by telephone.  Last AWV:  09/07/2019  Please schedule at anytime with [facility].  40 minute appointment  Any questions, please contact me at [phone]

## 2020-10-08 ENCOUNTER — Telehealth: Payer: Self-pay | Admitting: Family Medicine

## 2020-10-08 NOTE — Telephone Encounter (Signed)
Copied from CRM [id number]. Topic: Medicare AWV >> Oct 08, 2020  5:09 PM Claudette Laws R wrote: Reason for CRM:  Left message for patient to call back and schedule Medicare Annual Wellness Visit (AWV) in office.   If unable to come into the office for AWV,  please offer to do virtually or by telephone.  Last AWV: 09/07/2019   Please schedule at anytime with [facility] Advisor.  40 minute appointment  Any questions, please contact me at [phone]

## 2020-10-09 DIAGNOSIS — H2513 Age-related nuclear cataract, bilateral: Secondary | ICD-10-CM | POA: Diagnosis not present

## 2020-10-12 ENCOUNTER — Other Ambulatory Visit: Payer: Self-pay

## 2020-11-05 ENCOUNTER — Ambulatory Visit (INDEPENDENT_AMBULATORY_CARE_PROVIDER_SITE_OTHER): Payer: Medicare PPO

## 2020-11-05 VITALS — BP 136/68 | HR 71 | Temp 98.1°F | Resp 15 | Ht 68.0 in | Wt 156.0 lb

## 2020-11-05 DIAGNOSIS — Z0000 Encounter for general adult medical examination without abnormal findings: Secondary | ICD-10-CM

## 2020-11-05 NOTE — Progress Notes (Signed)
Subjective:   Christopher Browning is a 70 y.o. male who presents for Medicare Annual/Subsequent preventive examination.  Review of Systems    Cardiac Risk Factors include: advanced age (>78men, >26 women);hypertension;male gender;dyslipidemia     Objective:    Today's Vitals   11/05/20 1114  BP: 136/68  Pulse: 71  Resp: 15  Temp: 98.1 F (36.7 C)  TempSrc: Oral  SpO2: 96%  Weight: 156 lb (70.8 kg)  Height: 5\' 8"  (1.727 m)   Body mass index is 23.72 kg/m.  Advanced Directives 11/05/2020 09/07/2019 04/22/2019 11/29/2014  Does Patient Have a Medical Advance Directive? No No No Yes  Type of Advance Directive - - - Living will  Does patient want to make changes to medical advance directive? - Yes (MAU/Ambulatory/Procedural Areas - Information given) - -  Copy of Healthcare Power of Attorney in Chart? - - - No - copy requested  Would patient like information on creating a medical advance directive? Yes (MAU/Ambulatory/Procedural Areas - Information given) - No - Patient declined -    Current Medications (verified) Outpatient Encounter Medications as of 11/05/2020  Medication Sig   amLODipine (NORVASC) 10 MG tablet Take 1 tablet (10 mg total) by mouth daily.   aspirin EC 81 MG tablet Take 1 tablet (81 mg total) by mouth daily.   atorvastatin (LIPITOR) 10 MG tablet Take 1 tablet (10 mg total) by mouth daily.   cetirizine (ZYRTEC) 10 MG tablet Take 10 mg by mouth daily.   hydrochlorothiazide (HYDRODIURIL) 25 MG tablet Take 1 tablet (25 mg total) by mouth daily.   metoprolol succinate (TOPROL-XL) 50 MG 24 hr tablet Take 1 tablet (50 mg total) by mouth daily. Take with or immediately following a meal.   Potassium Chloride ER 20 MEQ TBCR Take 1 tablet by mouth daily.   No facility-administered encounter medications on file as of 11/05/2020.    Allergies (verified) Patient has no known allergies.   History: Past Medical History:  Diagnosis Date   Hyperlipidemia    Hypertension     Past Surgical History:  Procedure Laterality Date   COLONOSCOPY  2011   cleared for 5 yrs-   History reviewed. No pertinent family history. Social History   Socioeconomic History   Marital status: Married    Spouse name: Not on file   Number of children: Not on file   Years of education: Not on file   Highest education level: Not on file  Occupational History   Not on file  Tobacco Use   Smoking status: Never   Smokeless tobacco: Never  Substance and Sexual Activity   Alcohol use: No    Alcohol/week: 0.0 standard drinks   Drug use: No   Sexual activity: Not on file  Other Topics Concern   Not on file  Social History Narrative   Not on file   Social Determinants of Health   Financial Resource Strain: Low Risk    Difficulty of Paying Living Expenses: Not very hard  Food Insecurity: No Food Insecurity   Worried About Running Out of Food in the Last Year: Never true   Ran Out of Food in the Last Year: Never true  Transportation Needs: No Transportation Needs   Lack of Transportation (Medical): No   Lack of Transportation (Non-Medical): No  Physical Activity: Sufficiently Active   Days of Exercise per Week: 5 days   Minutes of Exercise per Session: 40 min  Stress: No Stress Concern Present   Feeling of Stress :  Not at all  Social Connections: Moderately Isolated   Frequency of Communication with Friends and Family: More than three times a week   Frequency of Social Gatherings with Friends and Family: Three times a week   Attends Religious Services: Never   Active Member of Clubs or Organizations: No   Attends Engineer, structural: Never   Marital Status: Married    Tobacco Counseling Counseling given: Not Answered   Clinical Intake:  Pre-visit preparation completed: Yes  Pain : No/denies pain     BMI - recorded: 23.72 Nutritional Risks: None Diabetes: No  How often do you need to have someone help you when you read instructions, pamphlets,  or other written materials from your doctor or pharmacy?: 1 - Never    Interpreter Needed?: No  Information entered by :: Theresa Mulligan Lpn   Activities of Daily Living In your present state of health, do you have any difficulty performing the following activities: 11/05/2020  Hearing? N  Vision? N  Difficulty concentrating or making decisions? N  Walking or climbing stairs? N  Dressing or bathing? N  Doing errands, shopping? N  Preparing Food and eating ? N  Using the Toilet? N  In the past six months, have you accidently leaked urine? N  Do you have problems with loss of bowel control? N  Managing your Medications? N  Managing your Finances? N  Housekeeping or managing your Housekeeping? N  Some recent data might be hidden    Patient Care Team: Duanne Limerick, MD as PCP - General (Family Medicine)  Indicate any recent Medical Services you may have received from other than [facility] providers in the past year (date may be approximate).     Assessment:   This is a routine wellness examination for Merek.  Hearing/Vision screen Hearing Screening - Comments:: Pt denies hearing difficulty Vision Screening - Comments:: Annual vision screening don at [facility]  Dietary issues and exercise activities discussed: Current Exercise Habits: Home exercise routine, Type of exercise: walking, Time (Minutes): 40, Frequency (Times/Week): 5, Weekly Exercise (Minutes/Week): 200, Intensity: Moderate, Exercise limited by: None identified   Goals Addressed             This Visit's Progress    DIET - INCREASE WATER INTAKE   On track    Recommend drinking 6-8 glasses of water per day       Depression Screen PHQ 2/9 Scores 11/05/2020 05/18/2020 01/18/2020 09/07/2019 07/29/2019 04/25/2019 04/18/2019  PHQ - 2 Score 0 0 0 0 0 0 0  PHQ- 9 Score - 0 0 - 0 0 0    Fall Risk Fall Risk  11/05/2020 09/07/2019 07/29/2019 04/25/2019 04/18/2019  Falls in the past year? 0 0 0 0 0  Number falls  in past yr: 0 0 - - 0  Injury with Fall? 0 0 - - 0  Risk for fall due to : No Fall Risks Impaired vision - Impaired balance/gait No Fall Risks  Follow up Falls prevention discussed Falls prevention discussed Falls evaluation completed Falls evaluation completed Falls evaluation completed    FALL RISK PREVENTION PERTAINING TO THE HOME:  Any stairs in or around the home? Yes  If so, are there any without handrails? No  Home free of loose throw rugs in walkways, pet beds, electrical cords, etc? Yes  Adequate lighting in your home to reduce risk of falls? Yes   ASSISTIVE DEVICES UTILIZED TO PREVENT FALLS:  Life alert? No  Use of a  cane, walker or w/c? No  Grab bars in the bathroom? No  Shower chair or bench in shower? Yes  Elevated toilet seat or a handicapped toilet? No   TIMED UP AND GO:  Was the test performed? Yes .  Length of time to ambulate 10 feet: 5 sec.   Gait steady and fast without use of assistive device  Cognitive Function: Normal cognitive status assessed by direct observation by this Nurse Health Advisor. No abnormalities found.          Immunizations Immunization History  Administered Date(s) Administered   Influenza, High Dose Seasonal PF 11/04/2017, 10/12/2020   Influenza,inj,Quad PF,6+ Mos 10/03/2016, 10/28/2018   Influenza-Unspecified 09/28/2019   Moderna Sars-Covid-2 Vaccination 03/11/2019, 04/08/2019, 12/06/2019   Pneumococcal Conjugate-13 08/04/2017   Pneumococcal Polysaccharide-23 01/31/2019   Tdap 11/29/2015    TDAP status: Up to date  Flu Vaccine status: Up to date  Pneumococcal vaccine status: Up to date  Covid-19 vaccine status: Completed vaccines  Qualifies for Shingles Vaccine? Yes   Zostavax completed No   Shingrix Completed?: No.    Education has been provided regarding the importance of this vaccine. Patient has been advised to call insurance company to determine out of pocket expense if they have not yet received this vaccine.  Advised may also receive vaccine at local pharmacy or Health Dept. Verbalized acceptance and understanding.  Screening Tests Health Maintenance  Topic Date Due   Zoster Vaccines- Shingrix (1 of 2) Never done   COVID-19 Vaccine (4 - Booster for Moderna series) 04/04/2020   COLONOSCOPY (Pts 45-39yrs Insurance coverage will need to be confirmed)  11/28/2025   TETANUS/TDAP  11/28/2025   INFLUENZA VACCINE  Completed   Hepatitis C Screening  Completed   HPV VACCINES  Aged Out    Health Maintenance  Health Maintenance Due  Topic Date Due   Zoster Vaccines- Shingrix (1 of 2) Never done   COVID-19 Vaccine (4 - Booster for Moderna series) 04/04/2020    Colorectal cancer screening: Type of screening: Colonoscopy. Completed 11/29/15. Repeat every 10 years  Lung Cancer Screening: (Low Dose CT Chest recommended if Age 82-80 years, 30 pack-year currently smoking OR have quit w/in 15years.) does not qualify.   Additional Screening:  Hepatitis C Screening: does qualify; Completed 02/04/17  Vision Screening: Recommended annual ophthalmology exams for early detection of glaucoma and other disorders of the eye. Is the patient up to date with their annual eye exam?  Yes  Who is the provider or what is the name of the office in which the patient attends annual eye exams? [facility].   Dental Screening: Recommended annual dental exams for proper oral hygiene  Community Resource Referral / Chronic Care Management: CRR required this visit?  No   CCM required this visit?  No      Plan:     I have personally reviewed and noted the following in the patient's chart:   Medical and social history Use of alcohol, tobacco or illicit drugs  Current medications and supplements including opioid prescriptions. Patient is not currently taking opioid prescriptions. Functional ability and status Nutritional status Physical activity Advanced directives List of other physicians Hospitalizations,  surgeries, and ER visits in previous 12 months Vitals Screenings to include cognitive, depression, and falls Referrals and appointments  In addition, I have reviewed and discussed with patient certain preventive protocols, quality metrics, and best practice recommendations. A written personalized care plan for preventive services as well as general preventive health recommendations were provided  to patient.     Tillie Rung, LPN   [id number]   Nurse Notes: none

## 2020-11-05 NOTE — Patient Instructions (Signed)
Christopher Browning , Thank you for taking time to come for your Medicare Wellness Visit. I appreciate your ongoing commitment to your health goals. Please review the following plan we discussed and let me know if I can assist you in the future.   Screening recommendations/referrals: Colonoscopy: Done 11/28/2020  Recommended yearly ophthalmology/optometry visit for glaucoma screening and checkup Recommended yearly dental visit for hygiene and checkup  Vaccinations: Influenza vaccine: done 10/12/20 Pneumococcal vaccine: 01/31/19 Tdap vaccine: 11/29/2015 Shingles vaccine: Shingrix discussed. Please contact your pharmacy for coverage information.   Covid-19: 03/11/19, 04/08/2019, 12/06/2019  Advanced directives: Advance directive discussed with you today. I have provided a copy for you to complete at home and have notarized. Once this is complete please bring a copy in to our office so we can scan it into your chart.   Conditions/risks identified: Keep up the great work!   Next appointment: Follow up in one year for your annual wellness visit.   Preventive Care 7 Years and Older, Male Preventive care refers to lifestyle choices and visits with your health care provider that can promote health and wellness. What does preventive care include? A yearly physical exam. This is also called an annual well check. Dental exams once or twice a year. Routine eye exams. Ask your health care provider how often you should have your eyes checked. Personal lifestyle choices, including: Daily care of your teeth and gums. Regular physical activity. Eating a healthy diet. Avoiding tobacco and drug use. Limiting alcohol use. Practicing safe sex. Taking low doses of aspirin every day. Taking vitamin and mineral supplements as recommended by your health care provider. What happens during an annual well check? The services and screenings done by your health care provider during your annual well check will depend on your  age, overall health, lifestyle risk factors, and family history of disease. Counseling  Your health care provider may ask you questions about your: Alcohol use. Tobacco use. Drug use. Emotional well-being. Home and relationship well-being. Sexual activity. Eating habits. History of falls. Memory and ability to understand (cognition). Work and work Astronomer. Screening  You may have the following tests or measurements: Height, weight, and BMI. Blood pressure. Lipid and cholesterol levels. These may be checked every 5 years, or more frequently if you are over 58 years old. Skin check. Lung cancer screening. You may have this screening every year starting at age 43 if you have a 30-pack-year history of smoking and currently smoke or have quit within the past 15 years. Fecal occult blood test (FOBT) of the stool. You may have this test every year starting at age 55. Flexible sigmoidoscopy or colonoscopy. You may have a sigmoidoscopy every 5 years or a colonoscopy every 10 years starting at age 46. Prostate cancer screening. Recommendations will vary depending on your family history and other risks. Hepatitis C blood test. Hepatitis B blood test. Sexually transmitted disease (STD) testing. Diabetes screening. This is done by checking your blood sugar (glucose) after you have not eaten for a while (fasting). You may have this done every 1-3 years. Abdominal aortic aneurysm (AAA) screening. You may need this if you are a current or former smoker. Osteoporosis. You may be screened starting at age 28 if you are at high risk. Talk with your health care provider about your test results, treatment options, and if necessary, the need for more tests. Vaccines  Your health care provider may recommend certain vaccines, such as: Influenza vaccine. This is recommended every year. Tetanus, diphtheria, and  acellular pertussis (Tdap, Td) vaccine. You may need a Td booster every 10 years. Zoster  vaccine. You may need this after age 31. Pneumococcal 13-valent conjugate (PCV13) vaccine. One dose is recommended after age 74. Pneumococcal polysaccharide (PPSV23) vaccine. One dose is recommended after age 11. Talk to your health care provider about which screenings and vaccines you need and how often you need them. This information is not intended to replace advice given to you by your health care provider. Make sure you discuss any questions you have with your health care provider. Document Released: 02/09/2015 Document Revised: 10/03/2015 Document Reviewed: 11/14/2014 Elsevier Interactive Patient Education  2017 [place] Prevention in the Home Falls can cause injuries. They can happen to people of all ages. There are many things you can do to make your home safe and to help prevent falls. What can I do on the outside of my home? Regularly fix the edges of walkways and driveways and fix any cracks. Remove anything that might make you trip as you walk through a door, such as a raised step or threshold. Trim any bushes or trees on the path to your home. Use bright outdoor lighting. Clear any walking paths of anything that might make someone trip, such as rocks or tools. Regularly check to see if handrails are loose or broken. Make sure that both sides of any steps have handrails. Any raised decks and porches should have guardrails on the edges. Have any leaves, snow, or ice cleared regularly. Use sand or salt on walking paths during winter. Clean up any spills in your garage right away. This includes oil or grease spills. What can I do in the bathroom? Use night lights. Install grab bars by the toilet and in the tub and shower. Do not use towel bars as grab bars. Use non-skid mats or decals in the tub or shower. If you need to sit down in the shower, use a plastic, non-slip stool. Keep the floor dry. Clean up any water that spills on the floor as soon as it happens. Remove  soap buildup in the tub or shower regularly. Attach bath mats securely with double-sided non-slip rug tape. Do not have throw rugs and other things on the floor that can make you trip. What can I do in the bedroom? Use night lights. Make sure that you have a light by your bed that is easy to reach. Do not use any sheets or blankets that are too big for your bed. They should not hang down onto the floor. Have a firm chair that has side arms. You can use this for support while you get dressed. Do not have throw rugs and other things on the floor that can make you trip. What can I do in the kitchen? Clean up any spills right away. Avoid walking on wet floors. Keep items that you use a lot in easy-to-reach places. If you need to reach something above you, use a strong step stool that has a grab bar. Keep electrical cords out of the way. Do not use floor polish or wax that makes floors slippery. If you must use wax, use non-skid floor wax. Do not have throw rugs and other things on the floor that can make you trip. What can I do with my stairs? Do not leave any items on the stairs. Make sure that there are handrails on both sides of the stairs and use them. Fix handrails that are broken or loose. Make sure that  handrails are as long as the stairways. Check any carpeting to make sure that it is firmly attached to the stairs. Fix any carpet that is loose or worn. Avoid having throw rugs at the top or bottom of the stairs. If you do have throw rugs, attach them to the floor with carpet tape. Make sure that you have a light switch at the top of the stairs and the bottom of the stairs. If you do not have them, ask someone to add them for you. What else can I do to help prevent falls? Wear shoes that: Do not have high heels. Have rubber bottoms. Are comfortable and fit you well. Are closed at the toe. Do not wear sandals. If you use a stepladder: Make sure that it is fully opened. Do not climb a  closed stepladder. Make sure that both sides of the stepladder are locked into place. Ask someone to hold it for you, if possible. Clearly mark and make sure that you can see: Any grab bars or handrails. First and last steps. Where the edge of each step is. Use tools that help you move around (mobility aids) if they are needed. These include: Canes. Walkers. Scooters. Crutches. Turn on the lights when you go into a dark area. Replace any light bulbs as soon as they burn out. Set up your furniture so you have a clear path. Avoid moving your furniture around. If any of your floors are uneven, fix them. If there are any pets around you, be aware of where they are. Review your medicines with your doctor. Some medicines can make you feel dizzy. This can increase your chance of falling. Ask your doctor what other things that you can do to help prevent falls. This information is not intended to replace advice given to you by your health care provider. Make sure you discuss any questions you have with your health care provider. Document Released: 11/09/2008 Document Revised: 06/21/2015 Document Reviewed: 02/17/2014 Elsevier Interactive Patient Education  2017 [employer].

## 2020-11-19 ENCOUNTER — Ambulatory Visit: Payer: Medicare PPO | Admitting: Family Medicine

## 2020-11-19 ENCOUNTER — Encounter: Payer: Self-pay | Admitting: Family Medicine

## 2020-11-19 ENCOUNTER — Other Ambulatory Visit: Payer: Self-pay

## 2020-11-19 VITALS — BP 138/84 | HR 78 | Ht 68.0 in | Wt 154.0 lb

## 2020-11-19 DIAGNOSIS — I10 Essential (primary) hypertension: Secondary | ICD-10-CM | POA: Diagnosis not present

## 2020-11-19 DIAGNOSIS — E876 Hypokalemia: Secondary | ICD-10-CM

## 2020-11-19 DIAGNOSIS — E7800 Pure hypercholesterolemia, unspecified: Secondary | ICD-10-CM | POA: Diagnosis not present

## 2020-11-19 MED ORDER — AMLODIPINE BESYLATE 10 MG PO TABS: 10.0000 mg | ORAL_TABLET | Freq: Every day | ORAL | 1 refills | Status: DC

## 2020-11-19 MED ORDER — METOPROLOL SUCCINATE ER 50 MG PO TB24: 50.0000 mg | ORAL_TABLET | Freq: Every day | ORAL | 1 refills | Status: DC

## 2020-11-19 MED ORDER — ATORVASTATIN CALCIUM 10 MG PO TABS: 10.0000 mg | ORAL_TABLET | Freq: Every day | ORAL | 1 refills | Status: DC

## 2020-11-19 MED ORDER — HYDROCHLOROTHIAZIDE 25 MG PO TABS: 25.0000 mg | ORAL_TABLET | Freq: Every day | ORAL | 1 refills | Status: DC

## 2020-11-19 MED ORDER — POTASSIUM CHLORIDE ER 20 MEQ PO TBCR: 1.0000 | EXTENDED_RELEASE_TABLET | Freq: Every day | ORAL | 1 refills | Status: DC

## 2020-11-19 NOTE — Progress Notes (Signed)
Date:  11/19/2020   Name:  Christopher Browning   DOB:  Sep 17, 1950   MRN:  [id number]   Chief Complaint: Hypertension, hypokalemia, and Hyperlipidemia  Hypertension This is a chronic problem. The current episode started more than 1 year ago. The problem has been resolved since onset. The problem is controlled. Pertinent negatives include no anxiety, blurred vision, chest pain, headaches, malaise/fatigue, neck pain, orthopnea, palpitations, peripheral edema or shortness of breath. Past treatments include beta blockers, diuretics and calcium channel blockers. The current treatment provides significant improvement. There are no compliance problems.  There is no history of angina, kidney disease, CAD/MI, CVA, heart failure, left ventricular hypertrophy, PVD or retinopathy. There is no history of chronic renal disease, a hypertension causing med or renovascular disease.  Hyperlipidemia This is a chronic problem. The current episode started more than 1 year ago. The problem is controlled. Recent lipid tests were reviewed and are normal. Exacerbating diseases include hypothyroidism. He has no history of chronic renal disease, diabetes, liver disease or obesity. Factors aggravating his hyperlipidemia include thiazides. Pertinent negatives include no chest pain, leg pain, myalgias or shortness of breath. Current antihyperlipidemic treatment includes statins. The current treatment provides significant improvement of lipids. There are no compliance problems.  Risk factors for coronary artery disease include male sex and hypertension.   Lab Results  Component Value Date   CREATININE 1.04 05/18/2020   BUN 15 05/18/2020   NA 140 05/18/2020   K 3.4 (L) 05/18/2020   CL 98 05/18/2020   CO2 26 05/18/2020   Lab Results  Component Value Date   CHOL 113 05/18/2020   HDL 45 05/18/2020   LDLCALC 57 05/18/2020   TRIG 42 05/18/2020   CHOLHDL 2.8 08/04/2017   No results found for: TSH Lab Results  Component Value  Date   HGBA1C 5.6 07/29/2019   Lab Results  Component Value Date   WBC 11.9 (H) 04/22/2019   HGB 15.5 04/22/2019   HCT 44.5 04/22/2019   MCV 85.9 04/22/2019   PLT 390 04/22/2019   Lab Results  Component Value Date   ALT 29 05/18/2020   AST 26 05/18/2020   ALKPHOS 45 05/18/2020   BILITOT 0.6 05/18/2020     Review of Systems  Constitutional:  Negative for chills, fever and malaise/fatigue.  HENT:  Negative for drooling, ear discharge, ear pain and sore throat.   Eyes:  Negative for blurred vision.  Respiratory:  Negative for cough, shortness of breath and wheezing.   Cardiovascular:  Negative for chest pain, palpitations, orthopnea and leg swelling.  Gastrointestinal:  Negative for abdominal pain, blood in stool, constipation, diarrhea and nausea.  Endocrine: Negative for polydipsia.  Genitourinary:  Negative for dysuria, frequency, hematuria and urgency.  Musculoskeletal:  Negative for back pain, myalgias and neck pain.  Skin:  Negative for rash.  Allergic/Immunologic: Negative for environmental allergies.  Neurological:  Negative for dizziness and headaches.  Hematological:  Does not bruise/bleed easily.  Psychiatric/Behavioral:  Negative for suicidal ideas. The patient is not nervous/anxious.    Patient Active Problem List   Diagnosis Date Noted   Hypokalemia 07/29/2019   Lumbar radiculopathy 04/18/2019   Aortic atherosclerosis (HCC) 04/18/2019   Atherosclerosis of both carotid arteries 02/04/2017   Cerebral vascular disease 02/04/2017   History of atrial fibrillation 02/04/2017   TIA involving right internal carotid artery 09/11/2016   Pure hypercholesterolemia 09/11/2016   Essential hypertension 11/29/2014    No Known Allergies  Past Surgical History:  Procedure Laterality  Date   COLONOSCOPY  2011   cleared for 5 yrs-    Social History   Tobacco Use   Smoking status: Never   Smokeless tobacco: Never  Substance Use Topics   Alcohol use: No     Alcohol/week: 0.0 standard drinks   Drug use: No     Medication list has been reviewed and updated.  Current Meds  Medication Sig   amLODipine (NORVASC) 10 MG tablet Take 1 tablet (10 mg total) by mouth daily.   aspirin EC 81 MG tablet Take 1 tablet (81 mg total) by mouth daily.   atorvastatin (LIPITOR) 10 MG tablet Take 1 tablet (10 mg total) by mouth daily.   cetirizine (ZYRTEC) 10 MG tablet Take 10 mg by mouth daily.   hydrochlorothiazide (HYDRODIURIL) 25 MG tablet Take 1 tablet (25 mg total) by mouth daily.   metoprolol succinate (TOPROL-XL) 50 MG 24 hr tablet Take 1 tablet (50 mg total) by mouth daily. Take with or immediately following a meal.   Potassium Chloride ER 20 MEQ TBCR Take 1 tablet by mouth daily.    PHQ 2/9 Scores 11/19/2020 11/05/2020 05/18/2020 01/18/2020  PHQ - 2 Score 0 0 0 0  PHQ- 9 Score 0 - 0 0    GAD 7 : Generalized Anxiety Score 11/19/2020 05/18/2020 07/29/2019 04/25/2019  Nervous, Anxious, on Edge 0 0 0 0  Control/stop worrying 0 0 0 0  Worry too much - different things 0 0 0 0  Trouble relaxing 0 0 0 0  Restless 0 0 0 0  Easily annoyed or irritable 0 0 0 0  Afraid - awful might happen 0 0 0 0  Total GAD 7 Score 0 0 0 0  Anxiety Difficulty - - - -    BP Readings from Last 3 Encounters:  11/19/20 138/84  11/05/20 136/68  05/18/20 130/70    Physical Exam Vitals and nursing note reviewed.  HENT:     Head: Normocephalic.     Right Ear: External ear normal.     Left Ear: External ear normal.     Nose: Nose normal.  Eyes:     General: No scleral icterus.       Right eye: No discharge.        Left eye: No discharge.     Conjunctiva/sclera: Conjunctivae normal.     Pupils: Pupils are equal, round, and reactive to light.  Neck:     Thyroid: No thyromegaly.     Vascular: No JVD.     Trachea: No tracheal deviation.  Cardiovascular:     Rate and Rhythm: Normal rate and regular rhythm.     Heart sounds: Normal heart sounds. No murmur heard.   No  friction rub. No gallop.  Pulmonary:     Effort: No respiratory distress.     Breath sounds: Normal breath sounds. No wheezing, rhonchi or rales.  Abdominal:     General: Bowel sounds are normal.     Palpations: Abdomen is soft. There is no mass.     Tenderness: There is no abdominal tenderness. There is no guarding or rebound.  Musculoskeletal:        General: No tenderness. Normal range of motion.     Cervical back: Normal range of motion and neck supple.  Lymphadenopathy:     Cervical: No cervical adenopathy.  Skin:    General: Skin is warm.     Findings: No rash.  Neurological:     Mental Status: He is  alert and oriented to person, place, and time.     Cranial Nerves: No cranial nerve deficit.    Wt Readings from Last 3 Encounters:  11/19/20 154 lb (69.9 kg)  11/05/20 156 lb (70.8 kg)  05/18/20 153 lb (69.4 kg)    BP 138/84   Pulse 78   Ht 5\' 8"  (1.727 m)   Wt 154 lb (69.9 kg)   BMI 23.42 kg/m   Assessment and Plan:  1. Essential hypertension Chronic.  Controlled.  Stable.  Blood pressure 138/84.  Continue amlodipine 10 mg once a day, hydrochlorothiazide 25 mg once a day, and metoprolol XL 50 mg once a day.  Will check renal function panel for electrolytes and GFR. - amLODipine (NORVASC) 10 MG tablet; Take 1 tablet (10 mg total) by mouth daily.  Dispense: 90 tablet; Refill: 1 - hydrochlorothiazide (HYDRODIURIL) 25 MG tablet; Take 1 tablet (25 mg total) by mouth daily.  Dispense: 90 tablet; Refill: 1 - metoprolol succinate (TOPROL-XL) 50 MG 24 hr tablet; Take 1 tablet (50 mg total) by mouth daily. Take with or immediately following a meal.  Dispense: 90 tablet; Refill: 1 - Renal Function Panel  2. Pure hypercholesterolemia Chronic.  Controlled.  Stable.  Continue atorvastatin 10 mg once a day.  Checking of the lipid panel is appropriate at this time and will recheck in 6 months - atorvastatin (LIPITOR) 10 MG tablet; Take 1 tablet (10 mg total) by mouth daily.   Dispense: 90 tablet; Refill: 1  3. Hypokalemia Chronic.  Controlled.  Stable.  Patient with a history of decreased potassium and is currently supplementing with 20 mEq potassium chloride daily.  We will check potassium by renal function panel today. - Potassium Chloride ER 20 MEQ TBCR; Take 1 tablet by mouth daily.  Dispense: 90 tablet; Refill: 1 - Renal Function Panel

## 2020-11-20 LAB — RENAL FUNCTION PANEL
Albumin: 5 g/dL — ABNORMAL HIGH (ref 3.8–4.8)
BUN/Creatinine Ratio: 11 (ref 10–24)
BUN: 11 mg/dL (ref 8–27)
CO2: 25 mmol/L (ref 20–29)
Calcium: 10.4 mg/dL — ABNORMAL HIGH (ref 8.6–10.2)
Chloride: 95 mmol/L — ABNORMAL LOW (ref 96–106)
Creatinine, Ser: 1.04 mg/dL (ref 0.76–1.27)
Glucose: 100 mg/dL — ABNORMAL HIGH (ref 70–99)
Phosphorus: 3.2 mg/dL (ref 2.8–4.1)
Potassium: 3.8 mmol/L (ref 3.5–5.2)
Sodium: 139 mmol/L (ref 134–144)
eGFR: 78 mL/min/{1.73_m2} (ref >59)

## 2021-05-21 ENCOUNTER — Encounter: Payer: Self-pay | Admitting: Family Medicine

## 2021-05-21 ENCOUNTER — Ambulatory Visit: Payer: Medicare PPO | Admitting: Family Medicine

## 2021-05-21 VITALS — BP 130/78 | HR 72 | Ht 68.0 in | Wt 152.0 lb

## 2021-05-21 DIAGNOSIS — E876 Hypokalemia: Secondary | ICD-10-CM | POA: Diagnosis not present

## 2021-05-21 DIAGNOSIS — I10 Essential (primary) hypertension: Secondary | ICD-10-CM

## 2021-05-21 DIAGNOSIS — E7800 Pure hypercholesterolemia, unspecified: Secondary | ICD-10-CM | POA: Diagnosis not present

## 2021-05-21 MED ORDER — METOPROLOL SUCCINATE ER 50 MG PO TB24: 50.0000 mg | ORAL_TABLET | Freq: Every day | ORAL | 1 refills | Status: DC

## 2021-05-21 MED ORDER — AMLODIPINE BESYLATE 10 MG PO TABS: 10.0000 mg | ORAL_TABLET | Freq: Every day | ORAL | 1 refills | Status: DC

## 2021-05-21 MED ORDER — ATORVASTATIN CALCIUM 10 MG PO TABS: 10.0000 mg | ORAL_TABLET | Freq: Every day | ORAL | 1 refills | Status: DC

## 2021-05-21 MED ORDER — HYDROCHLOROTHIAZIDE 25 MG PO TABS: 25.0000 mg | ORAL_TABLET | Freq: Every day | ORAL | 1 refills | Status: DC

## 2021-05-21 MED ORDER — POTASSIUM CHLORIDE ER 20 MEQ PO TBCR: 1.0000 | EXTENDED_RELEASE_TABLET | Freq: Every day | ORAL | 1 refills | Status: DC

## 2021-05-21 NOTE — Progress Notes (Signed)
? ? ?Date:  05/21/2021  ? ?Name:  Christopher Browning   DOB:  [birth year]   MRN:  [id number] ? ? ?Chief Complaint: Hypertension, Hyperlipidemia, and hypokalemia ? ?Hypertension ?This is a chronic problem. The current episode started more than 1 year ago. The problem has been waxing and waning since onset. The problem is controlled. Pertinent negatives include no anxiety, blurred vision, chest pain, headaches, malaise/fatigue, neck pain, orthopnea, palpitations, peripheral edema, PND, shortness of breath or sweats. There are no associated agents to hypertension. There are no known risk factors for coronary artery disease. Past treatments include calcium channel blockers, beta blockers and diuretics. The current treatment provides moderate improvement. There are no compliance problems.  There is no history of angina, kidney disease, CAD/MI, CVA, heart failure, left ventricular hypertrophy, PVD or retinopathy. There is no history of chronic renal disease, a hypertension causing med or renovascular disease.  ?Hyperlipidemia ?This is a chronic problem. The current episode started more than 1 year ago. The problem is controlled. Recent lipid tests were reviewed and are variable. He has no history of chronic renal disease. Pertinent negatives include no chest pain, myalgias or shortness of breath. Current antihyperlipidemic treatment includes statins. The current treatment provides moderate improvement of lipids. There are no compliance problems.  Risk factors for coronary artery disease include dyslipidemia and hypertension.  ? ?Lab Results  ?Component Value Date  ? NA 139 11/19/2020  ? K 3.8 11/19/2020  ? CO2 25 11/19/2020  ? GLUCOSE 100 (H) 11/19/2020  ? BUN 11 11/19/2020  ? CREATININE 1.04 11/19/2020  ? CALCIUM 10.4 (H) 11/19/2020  ? EGFR 78 11/19/2020  ? GFRNONAA 66 04/25/2019  ? ?Lab Results  ?Component Value Date  ? CHOL 113 05/18/2020  ? HDL 45 05/18/2020  ? [place] 57 05/18/2020  ? TRIG 42 05/18/2020  ? CHOLHDL 2.8  08/04/2017  ? ?No results found for: TSH ?Lab Results  ?Component Value Date  ? HGBA1C 5.6 07/29/2019  ? ?Lab Results  ?Component Value Date  ? WBC 11.9 (H) 04/22/2019  ? HGB 15.5 04/22/2019  ? HCT 44.5 04/22/2019  ? MCV 85.9 04/22/2019  ? PLT 390 04/22/2019  ? ?Lab Results  ?Component Value Date  ? ALT 29 05/18/2020  ? AST 26 05/18/2020  ? ALKPHOS 45 05/18/2020  ? BILITOT 0.6 05/18/2020  ? ?No results found for: 25OHVITD2, [place], VD25OH  ? ?Review of Systems  ?Constitutional:  Negative for chills, fever and malaise/fatigue.  ?HENT:  Negative for drooling, ear discharge, ear pain and sore throat.   ?Eyes:  Negative for blurred vision.  ?Respiratory:  Negative for cough, shortness of breath and wheezing.   ?Cardiovascular:  Negative for chest pain, palpitations, orthopnea, leg swelling and PND.  ?Gastrointestinal:  Negative for abdominal pain, blood in stool, constipation, diarrhea and nausea.  ?Endocrine: Negative for polydipsia.  ?Genitourinary:  Negative for dysuria, frequency, hematuria and urgency.  ?Musculoskeletal:  Negative for back pain, myalgias and neck pain.  ?Skin:  Negative for rash.  ?Allergic/Immunologic: Negative for environmental allergies.  ?Neurological:  Negative for dizziness and headaches.  ?Hematological:  Does not bruise/bleed easily.  ?Psychiatric/Behavioral:  Negative for suicidal ideas. The patient is not nervous/anxious.   ? ?Patient Active Problem List  ? Diagnosis Date Noted  ? Hypokalemia 07/29/2019  ? Lumbar radiculopathy 04/18/2019  ? Aortic atherosclerosis ([facility]) 04/18/2019  ? Atherosclerosis of both carotid arteries 02/04/2017  ? Cerebral vascular disease 02/04/2017  ? History of atrial fibrillation 02/04/2017  ? TIA involving right  internal carotid artery 09/11/2016  ? Pure hypercholesterolemia 09/11/2016  ? Essential hypertension 11/29/2014  ? ? ?No Known Allergies ? ?Past Surgical History:  ?Procedure Laterality Date  ? COLONOSCOPY  2011  ? cleared for 5 yrs-  ? ? ?Social  History  ? ?Tobacco Use  ? Smoking status: Never  ? Smokeless tobacco: Never  ?Substance Use Topics  ? Alcohol use: No  ?  Alcohol/week: 0.0 standard drinks  ? Drug use: No  ? ? ? ?Medication list has been reviewed and updated. ? ?Current Meds  ?Medication Sig  ? amLODipine (NORVASC) 10 MG tablet Take 1 tablet (10 mg total) by mouth daily.  ? aspirin EC 81 MG tablet Take 1 tablet (81 mg total) by mouth daily.  ? atorvastatin (LIPITOR) 10 MG tablet Take 1 tablet (10 mg total) by mouth daily.  ? hydrochlorothiazide (HYDRODIURIL) 25 MG tablet Take 1 tablet (25 mg total) by mouth daily.  ? metoprolol succinate (TOPROL-XL) 50 MG 24 hr tablet Take 1 tablet (50 mg total) by mouth daily. Take with or immediately following a meal.  ? Potassium Chloride ER 20 MEQ TBCR Take 1 tablet by mouth daily.  ? ? ? ?  05/21/2021  ?  8:15 AM 11/19/2020  ?  8:25 AM 05/18/2020  ?  8:20 AM 07/29/2019  ?  8:26 AM  ?GAD 7 : Generalized Anxiety Score  ?Nervous, Anxious, on Edge 0 0 0 0  ?Control/stop worrying 0 0 0 0  ?Worry too much - different things 0 0 0 0  ?Trouble relaxing 0 0 0 0  ?Restless 0 0 0 0  ?Easily annoyed or irritable 0 0 0 0  ?Afraid - awful might happen 0 0 0 0  ?Total GAD 7 Score 0 0 0 0  ?Anxiety Difficulty Not difficult at all     ? ? ? ?  05/21/2021  ?  8:15 AM  ?Depression screen PHQ 2/9  ?Decreased Interest 0  ?Down, Depressed, Hopeless 0  ?PHQ - 2 Score 0  ?Altered sleeping 0  ?Tired, decreased energy 0  ?Change in appetite 0  ?Feeling bad or failure about yourself  0  ?Trouble concentrating 0  ?Moving slowly or fidgety/restless 0  ?Suicidal thoughts 0  ?PHQ-9 Score 0  ?Difficult doing work/chores Not difficult at all  ? ? ?BP Readings from Last 3 Encounters:  ?05/21/21 (!) 130/94  ?11/19/20 138/84  ?11/05/20 136/68  ? ? ?Physical Exam ?Vitals and nursing note reviewed.  ?HENT:  ?   Head: Normocephalic.  ?   Right Ear: Tympanic membrane and external ear normal.  ?   Left Ear: Tympanic membrane and external ear normal.  ?    Nose: Nose normal. No congestion.  ?Eyes:  ?   General: No scleral icterus.    ?   Right eye: No discharge.     ?   Left eye: No discharge.  ?   Conjunctiva/sclera: Conjunctivae normal.  ?   Pupils: Pupils are equal, round, and reactive to light.  ?Neck:  ?   Thyroid: No thyromegaly.  ?   Vascular: No JVD.  ?   Trachea: No tracheal deviation.  ?Cardiovascular:  ?   Rate and Rhythm: Normal rate and regular rhythm.  ?   Heart sounds: Normal heart sounds. No murmur heard. ?  No friction rub. No gallop.  ?Pulmonary:  ?   Effort: No respiratory distress.  ?   Breath sounds: Normal breath sounds. No wheezing or rales.  ?  Abdominal:  ?   General: Bowel sounds are normal.  ?   Palpations: Abdomen is soft. There is no mass.  ?   Tenderness: There is no abdominal tenderness. There is no guarding or rebound.  ?Musculoskeletal:     ?   General: No tenderness. Normal range of motion.  ?   Cervical back: Normal range of motion and neck supple.  ?Lymphadenopathy:  ?   Cervical: No cervical adenopathy.  ?Skin: ?   General: Skin is warm.  ?   Findings: No rash.  ?Neurological:  ?   Mental Status: He is alert and oriented to person, place, and time.  ?   Cranial Nerves: No cranial nerve deficit.  ?   Deep Tendon Reflexes: Reflexes are normal and symmetric.  ? ? ?Wt Readings from Last 3 Encounters:  ?05/21/21 152 lb (68.9 kg)  ?11/19/20 154 lb (69.9 kg)  ?11/05/20 156 lb (70.8 kg)  ? ? ?BP (!) 130/94   Pulse 80   Ht '5\' 8"'  (1.727 m)   Wt 152 lb (68.9 kg)   BMI 23.11 kg/m?  ? ?Assessment and Plan: ? ?1. Essential hypertension ?Chronic.  Controlled.  Stable.  Blood pressure 130/78.  Pulse rate is 73.  Continue amlodipine 10 mg 1 a day, hydrochlorothiazide 25 mg 1 a day, and metoprolol XL 50 mg 1 a day.  Will check CMP for electrolytes and GFR. ?- amLODipine (NORVASC) 10 MG tablet; Take 1 tablet (10 mg total) by mouth daily.  Dispense: 90 tablet; Refill: 1 ?- hydrochlorothiazide (HYDRODIURIL) 25 MG tablet; Take 1 tablet (25 mg total)  by mouth daily.  Dispense: 90 tablet; Refill: 1 ?- metoprolol succinate (TOPROL-XL) 50 MG 24 hr tablet; Take 1 tablet (50 mg total) by mouth daily. Take with or immediately following a meal.  Dispense: 90 tablet; R

## 2021-05-22 LAB — LIPID PANEL WITH LDL/HDL RATIO
Cholesterol, Total: 119 mg/dL (ref 100–199)
HDL: 49 mg/dL (ref >39)
LDL Chol Calc (NIH): 57 mg/dL (ref 0–99)
LDL/HDL Ratio: 1.2 ratio (ref 0.0–3.6)
Triglycerides: 60 mg/dL (ref 0–149)
VLDL Cholesterol Cal: 13 mg/dL (ref 5–40)

## 2021-05-22 LAB — COMPREHENSIVE METABOLIC PANEL
ALT: 43 IU/L (ref 0–44)
AST: 35 IU/L (ref 0–40)
Albumin/Globulin Ratio: 1.6 (ref 1.2–2.2)
Albumin: 4.7 g/dL (ref 3.8–4.8)
Alkaline Phosphatase: 60 IU/L (ref 44–121)
BUN/Creatinine Ratio: 10 (ref 10–24)
BUN: 12 mg/dL (ref 8–27)
Bilirubin Total: 0.6 mg/dL (ref 0.0–1.2)
CO2: 28 mmol/L (ref 20–29)
Calcium: 10.3 mg/dL — ABNORMAL HIGH (ref 8.6–10.2)
Chloride: 98 mmol/L (ref 96–106)
Creatinine, Ser: 1.16 mg/dL (ref 0.76–1.27)
Globulin, Total: 2.9 g/dL (ref 1.5–4.5)
Glucose: 106 mg/dL — ABNORMAL HIGH (ref 70–99)
Potassium: 4 mmol/L (ref 3.5–5.2)
Sodium: 141 mmol/L (ref 134–144)
Total Protein: 7.6 g/dL (ref 6.0–8.5)
eGFR: 68 mL/min/{1.73_m2} (ref >59)

## 2021-10-11 DIAGNOSIS — H524 Presbyopia: Secondary | ICD-10-CM | POA: Diagnosis not present

## 2021-10-11 DIAGNOSIS — H2513 Age-related nuclear cataract, bilateral: Secondary | ICD-10-CM | POA: Diagnosis not present

## 2021-11-06 ENCOUNTER — Ambulatory Visit (INDEPENDENT_AMBULATORY_CARE_PROVIDER_SITE_OTHER): Payer: Medicare PPO

## 2021-11-06 VITALS — Ht 68.0 in | Wt 152.0 lb

## 2021-11-06 DIAGNOSIS — Z0000 Encounter for general adult medical examination without abnormal findings: Secondary | ICD-10-CM

## 2021-11-06 NOTE — Progress Notes (Signed)
Subjective:   Christopher Browning is a 70 y.o. male who presents for Medicare Annual/Subsequent preventive examination.  I connected with  Christopher Browning on 11/06/21 by a audio enabled telemedicine application and verified that I am speaking with the correct person using two identifiers.  Patient Location: Home  Provider Location: Office/Clinic  I discussed the limitations of evaluation and management by telemedicine. The patient expressed understanding and agreed to proceed.    Review of Systems    Defer to PCP Cardiac Risk Factors include: advanced age (>58men, >20 women);male gender     Objective:    Today's Vitals   11/06/21 1120  Weight: 152 lb (68.9 kg)  Height: 5\' 8"  (1.727 m)   Body mass index is 23.11 kg/m.     11/06/2021   11:48 AM 11/05/2020   11:31 AM 09/07/2019    8:54 AM 04/22/2019    1:11 PM 11/29/2014    8:24 AM  Advanced Directives  Does Patient Have a Medical Advance Directive? Yes No No No Yes  Type of Advance Directive Living will    Living will  Does patient want to make changes to medical advance directive?   Yes (MAU/Ambulatory/Procedural Areas - Information given)    Copy of [place] in Chart?     No - copy requested  Would patient like information on creating a medical advance directive?  Yes (MAU/Ambulatory/Procedural Areas - Information given)  No - Patient declined     Current Medications (verified) Outpatient Encounter Medications as of 11/06/2021  Medication Sig   amLODipine (NORVASC) 10 MG tablet Take 1 tablet (10 mg total) by mouth daily.   aspirin EC 81 MG tablet Take 1 tablet (81 mg total) by mouth daily.   atorvastatin (LIPITOR) 10 MG tablet Take 1 tablet (10 mg total) by mouth daily.   cetirizine (ZYRTEC) 10 MG tablet Take 10 mg by mouth daily.   hydrochlorothiazide (HYDRODIURIL) 25 MG tablet Take 1 tablet (25 mg total) by mouth daily.   metoprolol succinate (TOPROL-XL) 50 MG 24 hr tablet Take 1 tablet (50 mg total)  by mouth daily. Take with or immediately following a meal.   Potassium Chloride ER 20 MEQ TBCR Take 1 tablet by mouth daily.   No facility-administered encounter medications on file as of 11/06/2021.    Allergies (verified) Patient has no known allergies.   History: Past Medical History:  Diagnosis Date   Hyperlipidemia    Hypertension    Past Surgical History:  Procedure Laterality Date   COLONOSCOPY  2011   cleared for 5 yrs-   History reviewed. No pertinent family history. Social History   Socioeconomic History   Marital status: Married    Spouse name: Not on file   Number of children: Not on file   Years of education: Not on file   Highest education level: Not on file  Occupational History   Not on file  Tobacco Use   Smoking status: Never   Smokeless tobacco: Never  Substance and Sexual Activity   Alcohol use: No    Alcohol/week: 0.0 standard drinks of alcohol   Drug use: No   Sexual activity: Not on file  Other Topics Concern   Not on file  Social History Narrative   Not on file   Social Determinants of Health   Financial Resource Strain: Low Risk  (11/06/2021)   Overall Financial Resource Strain (CARDIA)    Difficulty of Paying Living Expenses: Not hard at all  Food Insecurity: No Food Insecurity (11/06/2021)   Hunger Vital Sign    Worried About Running Out of Food in the Last Year: Never true    Ran Out of Food in the Last Year: Never true  Transportation Needs: No Transportation Needs (11/05/2020)   PRAPARE - Administrator, Civil Service (Medical): No    Lack of Transportation (Non-Medical): No  Physical Activity: Sufficiently Active (11/06/2021)   Exercise Vital Sign    Days of Exercise per Week: 6 days    Minutes of Exercise per Session: 60 min  Stress: No Stress Concern Present (11/06/2021)   [name]    Feeling of Stress : Not at all  Social Connections:  Moderately Isolated (11/06/2021)   Social Connection and Isolation Panel [NHANES]    Frequency of Communication with Friends and Family: More than three times a week    Frequency of Social Gatherings with Friends and Family: More than three times a week    Attends Religious Services: Never    Database administrator or Organizations: No    Attends Engineer, structural: Never    Marital Status: Married    Tobacco Counseling Counseling given: Not Answered   Clinical Intake:  Pre-visit preparation completed: Yes  Pain : No/denies pain     BMI - recorded: 23.11 Nutritional Status: BMI of 19-24  Normal Nutritional Risks: None Diabetes: No     Diabetic? NO.     Information entered by :: Margaretha Sheffield, CMA   Activities of Daily Living    11/06/2021   11:50 AM  In your present state of health, do you have any difficulty performing the following activities:  Hearing? 0  Vision? 0  Difficulty concentrating or making decisions? 0  Walking or climbing stairs? 0  Dressing or bathing? 0  Doing errands, shopping? 0  Preparing Food and eating ? N  Using the Toilet? N  In the past six months, have you accidently leaked urine? N  Do you have problems with loss of bowel control? N  Managing your Medications? N  Managing your Finances? N  Housekeeping or managing your Housekeeping? N    Patient Care Team: Duanne Limerick, MD as PCP - General (Family Medicine)  Indicate any recent Medical Services you may have received from other than [facility] providers in the past year (date may be approximate).     Assessment:   This is a routine wellness examination for Christopher Browning.  Hearing/Vision screen Hearing Screening - Comments:: No concerns. Vision Screening - Comments:: Wears prescription glasses.  Dietary issues and exercise activities discussed:     Goals Addressed   None   Depression Screen    11/06/2021   11:49 AM 05/21/2021    8:15 AM 11/19/2020    8:25 AM  11/05/2020   11:26 AM 05/18/2020    8:20 AM 01/18/2020    8:47 AM 09/07/2019    8:53 AM  PHQ 2/9 Scores  PHQ - 2 Score 0 0 0 0 0 0 0  PHQ- 9 Score 0 0 0  0 0     Fall Risk    11/06/2021   11:49 AM 05/21/2021    8:15 AM 11/05/2020   11:33 AM 09/07/2019    8:57 AM 07/29/2019    8:25 AM  Fall Risk   Falls in the past year? 0 0 0 0 0  Number falls in past yr: 0 0 0 0  Injury with Fall? 0 0 0 0   Risk for fall due to : No Fall Risks No Fall Risks No Fall Risks Impaired vision   Follow up Falls evaluation completed Falls evaluation completed Falls prevention discussed Falls prevention discussed Falls evaluation completed    FALL RISK PREVENTION PERTAINING TO THE HOME:  Any stairs in or around the home? No  If so, are there any without handrails?  N/A Home free of loose throw rugs in walkways, pet beds, electrical cords, etc? Yes  Adequate lighting in your home to reduce risk of falls? Yes   ASSISTIVE DEVICES UTILIZED TO PREVENT FALLS:  Life alert? No  Use of a cane, walker or w/c? Yes  Grab bars in the bathroom? No  Shower chair or bench in shower? Yes  Elevated toilet seat or a handicapped toilet? Yes   Cognitive Function:        11/06/2021   11:50 AM  6CIT Screen  What Year? 0 points  What month? 0 points  What time? 0 points  Count back from 20 0 points  Months in reverse 2 points  Repeat phrase 2 points  Total Score 4 points    Immunizations Immunization History  Administered Date(s) Administered   Influenza, High Dose Seasonal PF 11/04/2017, 10/12/2020   Influenza,inj,Quad PF,6+ Mos 10/03/2016, 10/28/2018   Influenza-Unspecified 09/28/2019   Moderna Sars-Covid-2 Vaccination 03/11/2019, 04/08/2019, 12/06/2019   Pneumococcal Conjugate-13 08/04/2017   Pneumococcal Polysaccharide-23 01/31/2019   Tdap 11/29/2015    TDAP status: Up to date  Flu Vaccine status: Due, Education has been provided regarding the importance of this vaccine. Advised may receive this  vaccine at local pharmacy or Health Dept. Aware to provide a copy of the vaccination record if obtained from local pharmacy or Health Dept. Verbalized acceptance and understanding.  Pneumococcal vaccine status: Up to date  Covid-19 vaccine status: Completed vaccines  Qualifies for Shingles Vaccine? Yes   Zostavax completed No   Shingrix Completed?: No.    Education has been provided regarding the importance of this vaccine. Patient has been advised to call insurance company to determine out of pocket expense if they have not yet received this vaccine. Advised may also receive vaccine at local pharmacy or Health Dept. Verbalized acceptance and understanding.  Screening Tests Health Maintenance  Topic Date Due   Zoster Vaccines- Shingrix (1 of 2) Never done   COVID-19 Vaccine (4 - Moderna series) 11/22/2021 (Originally 01/31/2020)   INFLUENZA VACCINE  04/27/2022 (Originally 08/27/2021)   COLONOSCOPY (Pts 45-22yrs Insurance coverage will need to be confirmed)  11/28/2025   TETANUS/TDAP  11/28/2025   Pneumonia Vaccine 48+ Years old  Completed   Hepatitis C Screening  Completed   HPV VACCINES  Aged Out    Health Maintenance  Health Maintenance Due  Topic Date Due   Zoster Vaccines- Shingrix (1 of 2) Never done    Colorectal cancer screening: Type of screening: Colonoscopy. Completed 08/19/2007. Repeat every 10 years  Lung Cancer Screening: (Low Dose CT Chest recommended if Age 13-80 years, 30 pack-year currently smoking OR have quit w/in 15years.) does not qualify.   Lung Cancer Screening Referral: N/A  Additional Screening:  Hepatitis C Screening: does qualify; Completed 02/04/2017  Vision Screening: Recommended annual ophthalmology exams for early detection of glaucoma and other disorders of the eye. Is the patient up to date with their annual eye exam?  Yes  Who is the provider or what is the name of the office in which the patient attends  annual eye exams? [facility] If  pt is not established with a provider, would they like to be referred to a provider to establish care?  N/A .   Dental Screening: Recommended annual dental exams for proper oral hygiene  Community Resource Referral / Chronic Care Management: CRR required this visit?  No   CCM required this visit?  No      Plan:     I have personally reviewed and noted the following in the patient's chart:   Medical and social history Use of alcohol, tobacco or illicit drugs  Current medications and supplements including opioid prescriptions. Patient is not currently taking opioid prescriptions. Functional ability and status Nutritional status Physical activity Advanced directives List of other physicians Hospitalizations, surgeries, and ER visits in previous 12 months Vitals Screenings to include cognitive, depression, and falls Referrals and appointments  In addition, I have reviewed and discussed with patient certain preventive protocols, quality metrics, and best practice recommendations. A written personalized care plan for preventive services as well as general preventive health recommendations were provided to patient.     Mariel Sleet, CMA   11/06/2021    Mr. Nunley , Thank you for taking time to come for your Medicare Wellness Visit. I appreciate your ongoing commitment to your health goals. Please review the following plan we discussed and let me know if I can assist you in the future.   These are the goals we discussed:  Goals      DIET - INCREASE WATER INTAKE     Recommend drinking 6-8 glasses of water per day        This is a list of the screening recommended for you and due dates:  Health Maintenance  Topic Date Due   Zoster (Shingles) Vaccine (1 of 2) Never done   COVID-19 Vaccine (4 - Moderna series) 11/22/2021*   Flu Shot  04/27/2022*   Colon Cancer Screening  11/28/2025   Tetanus Vaccine  11/28/2025   Pneumonia Vaccine  Completed   Hepatitis C Screening:  USPSTF Recommendation to screen - Ages 18-79 yo.  Completed   HPV Vaccine  Aged Out  *Topic was postponed. The date shown is not the original due date.      Nurse Notes: None.

## 2021-11-07 ENCOUNTER — Ambulatory Visit: Payer: Medicare PPO | Admitting: Family Medicine

## 2021-11-07 ENCOUNTER — Encounter: Payer: Self-pay | Admitting: Family Medicine

## 2021-11-07 VITALS — BP 138/60 | HR 64 | Ht 68.0 in | Wt 153.0 lb

## 2021-11-07 DIAGNOSIS — J019 Acute sinusitis, unspecified: Secondary | ICD-10-CM

## 2021-11-07 DIAGNOSIS — Z23 Encounter for immunization: Secondary | ICD-10-CM | POA: Diagnosis not present

## 2021-11-07 MED ORDER — AZITHROMYCIN 250 MG PO TABS: ORAL_TABLET | ORAL | 0 refills | Status: AC

## 2021-11-07 MED ORDER — FLUTICASONE PROPIONATE 50 MCG/ACT NA SUSP: 2.0000 | Freq: Every day | NASAL | 6 refills | Status: DC

## 2021-11-07 NOTE — Progress Notes (Signed)
Date:  11/07/2021   Name:  Christopher Browning   DOB:  09/16/1950   MRN:  [id number]   Chief Complaint: Cough (And congestion- yellow production occasionally. No fever, taking DayQuil and NyQuil for high blood pressure- helped but not cleared up)  Cough This is a new problem. The current episode started 1 to 4 weeks ago (2 weeks). The problem has been unchanged. The problem occurs every few hours. The cough is Productive of purulent sputum. Pertinent negatives include no chest pain, chills, ear congestion, ear pain, fever, headaches, nasal congestion, postnasal drip, rhinorrhea, sore throat, shortness of breath or wheezing. He has tried nothing for the symptoms. The treatment provided mild relief.    Lab Results  Component Value Date   NA 141 05/21/2021   K 4.0 05/21/2021   CO2 28 05/21/2021   GLUCOSE 106 (H) 05/21/2021   BUN 12 05/21/2021   CREATININE 1.16 05/21/2021   CALCIUM 10.3 (H) 05/21/2021   EGFR 68 05/21/2021   GFRNONAA 66 04/25/2019   Lab Results  Component Value Date   CHOL 119 05/21/2021   HDL 49 05/21/2021   LDLCALC 57 05/21/2021   TRIG 60 05/21/2021   CHOLHDL 2.8 08/04/2017   No results found for: "TSH" Lab Results  Component Value Date   HGBA1C 5.6 07/29/2019   Lab Results  Component Value Date   WBC 11.9 (H) 04/22/2019   HGB 15.5 04/22/2019   HCT 44.5 04/22/2019   MCV 85.9 04/22/2019   PLT 390 04/22/2019   Lab Results  Component Value Date   ALT 43 05/21/2021   AST 35 05/21/2021   ALKPHOS 60 05/21/2021   BILITOT 0.6 05/21/2021   No results found for: "25OHVITD2", "25OHVITD3", "VD25OH"   Review of Systems  Constitutional:  Negative for chills and fever.  HENT:  Negative for ear pain, hearing loss, mouth sores, postnasal drip, rhinorrhea and sore throat.   Respiratory:  Positive for cough. Negative for chest tightness, shortness of breath and wheezing.   Cardiovascular:  Negative for chest pain, palpitations and leg swelling.  Neurological:   Negative for headaches.    Patient Active Problem List   Diagnosis Date Noted   Hypokalemia 07/29/2019   Lumbar radiculopathy 04/18/2019   Aortic atherosclerosis ([place]) 04/18/2019   Atherosclerosis of both carotid arteries 02/04/2017   Cerebral vascular disease 02/04/2017   History of atrial fibrillation 02/04/2017   TIA involving right internal carotid artery 09/11/2016   Pure hypercholesterolemia 09/11/2016   Essential hypertension 11/29/2014    No Known Allergies  Past Surgical History:  Procedure Laterality Date   COLONOSCOPY  2011   cleared for 5 yrs-    Social History   Tobacco Use   Smoking status: Never   Smokeless tobacco: Never  Substance Use Topics   Alcohol use: No    Alcohol/week: 0.0 standard drinks of alcohol   Drug use: No     Medication list has been reviewed and updated.  Current Meds  Medication Sig   amLODipine (NORVASC) 10 MG tablet Take 1 tablet (10 mg total) by mouth daily.   aspirin EC 81 MG tablet Take 1 tablet (81 mg total) by mouth daily.   atorvastatin (LIPITOR) 10 MG tablet Take 1 tablet (10 mg total) by mouth daily.   cetirizine (ZYRTEC) 10 MG tablet Take 10 mg by mouth daily.   hydrochlorothiazide (HYDRODIURIL) 25 MG tablet Take 1 tablet (25 mg total) by mouth daily.   metoprolol succinate (TOPROL-XL) 50 MG 24 hr  tablet Take 1 tablet (50 mg total) by mouth daily. Take with or immediately following a meal.   Potassium Chloride ER 20 MEQ TBCR Take 1 tablet by mouth daily.       11/07/2021    3:31 PM 05/21/2021    8:15 AM 11/19/2020    8:25 AM 05/18/2020    8:20 AM  GAD 7 : Generalized Anxiety Score  Nervous, Anxious, on Edge 0 0 0 0  Control/stop worrying 0 0 0 0  Worry too much - different things 0 0 0 0  Trouble relaxing 0 0 0 0  Restless 0 0 0 0  Easily annoyed or irritable 0 0 0 0  Afraid - awful might happen 0 0 0 0  Total GAD 7 Score 0 0 0 0  Anxiety Difficulty Not difficult at all Not difficult at all          11/07/2021    3:31 PM 11/06/2021   11:49 AM 05/21/2021    8:15 AM  Depression screen PHQ 2/9  Decreased Interest 0 0 0  Down, Depressed, Hopeless 0 0 0  PHQ - 2 Score 0 0 0  Altered sleeping 0 0 0  Tired, decreased energy 0 0 0  Change in appetite 0 0 0  Feeling bad or failure about yourself  0 0 0  Trouble concentrating 0 0 0  Moving slowly or fidgety/restless 0 0 0  Suicidal thoughts 0 0 0  PHQ-9 Score 0 0 0  Difficult doing work/chores Not difficult at all Not difficult at all Not difficult at all    BP Readings from Last 3 Encounters:  11/07/21 138/60  05/21/21 130/78  11/19/20 138/84    Physical Exam Vitals and nursing note reviewed.  HENT:     Head: Normocephalic.     Right Ear: External ear normal.     Left Ear: External ear normal.     Nose: Nose normal.  Eyes:     General: No scleral icterus.       Right eye: No discharge.        Left eye: No discharge.     Conjunctiva/sclera: Conjunctivae normal.     Pupils: Pupils are equal, round, and reactive to light.  Neck:     Thyroid: No thyromegaly.     Vascular: No JVD.     Trachea: No tracheal deviation.  Cardiovascular:     Rate and Rhythm: Normal rate and regular rhythm.     Heart sounds: Normal heart sounds. No murmur heard.    No friction rub. No gallop.  Pulmonary:     Effort: No respiratory distress.     Breath sounds: Normal breath sounds. No wheezing or rales.  Abdominal:     General: Bowel sounds are normal.     Palpations: Abdomen is soft. There is no mass.     Tenderness: There is no abdominal tenderness. There is no guarding or rebound.  Musculoskeletal:        General: No tenderness.     Cervical back: Normal range of motion and neck supple.  Lymphadenopathy:     Cervical: No cervical adenopathy.  Skin:    Findings: No rash.  Neurological:     Mental Status: He is alert.     Wt Readings from Last 3 Encounters:  11/07/21 153 lb (69.4 kg)  11/06/21 152 lb (68.9 kg)  05/21/21 152 lb  (68.9 kg)    BP 138/60   Pulse 64   Ht 5'  8" (1.727 m)   Wt 153 lb (69.4 kg)   BMI 23.26 kg/m   Assessment and Plan:  1. Acute non-recurrent sinusitis, unspecified location New onset.  Persistent for 2 weeks.  Productive cough most likely from an upper nasal source.  We will treat with azithromycin to 50 mg 2 today followed by 1 a day for 4 days.  I have suggested Mucinex DM for cough.  Patient is also being given Flonase 1 spray each nostril once a day.  And has also been informed to use mask when mowing and raking yard.   Otilio Miu, MD

## 2021-11-20 ENCOUNTER — Ambulatory Visit: Payer: Medicare PPO | Admitting: Family Medicine

## 2021-11-20 ENCOUNTER — Encounter: Payer: Self-pay | Admitting: Family Medicine

## 2021-11-20 VITALS — BP 136/84 | HR 96 | Ht 68.0 in | Wt 153.0 lb

## 2021-11-20 DIAGNOSIS — J301 Allergic rhinitis due to pollen: Secondary | ICD-10-CM

## 2021-11-20 DIAGNOSIS — E876 Hypokalemia: Secondary | ICD-10-CM

## 2021-11-20 DIAGNOSIS — I10 Essential (primary) hypertension: Secondary | ICD-10-CM

## 2021-11-20 DIAGNOSIS — E7800 Pure hypercholesterolemia, unspecified: Secondary | ICD-10-CM | POA: Diagnosis not present

## 2021-11-20 MED ORDER — HYDROCHLOROTHIAZIDE 25 MG PO TABS: 25.0000 mg | ORAL_TABLET | Freq: Every day | ORAL | 1 refills | Status: DC

## 2021-11-20 MED ORDER — FLUTICASONE PROPIONATE 50 MCG/ACT NA SUSP: 2.0000 | Freq: Every day | NASAL | 6 refills | Status: DC

## 2021-11-20 MED ORDER — ATORVASTATIN CALCIUM 10 MG PO TABS: 10.0000 mg | ORAL_TABLET | Freq: Every day | ORAL | 1 refills | Status: DC

## 2021-11-20 MED ORDER — METOPROLOL SUCCINATE ER 50 MG PO TB24: 50.0000 mg | ORAL_TABLET | Freq: Every day | ORAL | 1 refills | Status: DC

## 2021-11-20 MED ORDER — AMLODIPINE BESYLATE 10 MG PO TABS: 10.0000 mg | ORAL_TABLET | Freq: Every day | ORAL | 1 refills | Status: DC

## 2021-11-20 MED ORDER — POTASSIUM CHLORIDE ER 20 MEQ PO TBCR: 1.0000 | EXTENDED_RELEASE_TABLET | Freq: Every day | ORAL | 1 refills | Status: DC

## 2021-11-20 NOTE — Progress Notes (Signed)
Date:  11/20/2021   Name:  Christopher Browning   DOB:  14-Oct-1950   MRN:  [id number]   Chief Complaint: hypokalemia, Hypertension, Allergic Rhinitis , and Hyperlipidemia  Hypertension This is a chronic problem. The current episode started more than 1 year ago. The problem has been gradually improving since onset. The problem is controlled. Pertinent negatives include no anxiety, blurred vision, chest pain, headaches, malaise/fatigue, neck pain, orthopnea, palpitations, peripheral edema, PND, shortness of breath or sweats. There are no associated agents to hypertension. Risk factors for coronary artery disease include dyslipidemia. Past treatments include calcium channel blockers, beta blockers and diuretics. The current treatment provides moderate improvement. There are no compliance problems.  There is no history of angina, kidney disease, CAD/MI, CVA, heart failure, left ventricular hypertrophy, PVD or retinopathy. There is no history of chronic renal disease.  Hyperlipidemia This is a chronic problem. The current episode started more than 1 year ago. The problem is controlled. Recent lipid tests were reviewed and are normal. He has no history of chronic renal disease, diabetes, hypothyroidism, liver disease, obesity or nephrotic syndrome. Pertinent negatives include no chest pain, focal weakness, leg pain, myalgias or shortness of breath. The current treatment provides moderate improvement of lipids. There are no compliance problems.     Lab Results  Component Value Date   NA 141 05/21/2021   K 4.0 05/21/2021   CO2 28 05/21/2021   GLUCOSE 106 (H) 05/21/2021   BUN 12 05/21/2021   CREATININE 1.16 05/21/2021   CALCIUM 10.3 (H) 05/21/2021   EGFR 68 05/21/2021   GFRNONAA 66 04/25/2019   Lab Results  Component Value Date   CHOL 119 05/21/2021   HDL 49 05/21/2021   LDLCALC 57 05/21/2021   TRIG 60 05/21/2021   CHOLHDL 2.8 08/04/2017   No results found for: "TSH" Lab Results  Component  Value Date   HGBA1C 5.6 07/29/2019   Lab Results  Component Value Date   WBC 11.9 (H) 04/22/2019   HGB 15.5 04/22/2019   HCT 44.5 04/22/2019   MCV 85.9 04/22/2019   PLT 390 04/22/2019   Lab Results  Component Value Date   ALT 43 05/21/2021   AST 35 05/21/2021   ALKPHOS 60 05/21/2021   BILITOT 0.6 05/21/2021   No results found for: "25OHVITD2", "25OHVITD3", "VD25OH"   Review of Systems  Constitutional:  Negative for chills, fever and malaise/fatigue.  HENT:  Negative for drooling, ear discharge, ear pain and sore throat.   Eyes:  Negative for blurred vision.  Respiratory:  Negative for cough, shortness of breath and wheezing.   Cardiovascular:  Negative for chest pain, palpitations, orthopnea, leg swelling and PND.  Gastrointestinal:  Negative for abdominal pain, blood in stool, constipation, diarrhea and nausea.  Endocrine: Negative for polydipsia.  Genitourinary:  Negative for dysuria, frequency, hematuria and urgency.  Musculoskeletal:  Negative for back pain, myalgias and neck pain.  Skin:  Negative for rash.  Allergic/Immunologic: Negative for environmental allergies.  Neurological:  Negative for dizziness, focal weakness and headaches.  Hematological:  Does not bruise/bleed easily.  Psychiatric/Behavioral:  Negative for suicidal ideas. The patient is not nervous/anxious.     Patient Active Problem List   Diagnosis Date Noted   Hypokalemia 07/29/2019   Lumbar radiculopathy 04/18/2019   Aortic atherosclerosis ([place]) 04/18/2019   Atherosclerosis of both carotid arteries 02/04/2017   Cerebral vascular disease 02/04/2017   History of atrial fibrillation 02/04/2017   TIA involving right internal carotid artery 09/11/2016  Pure hypercholesterolemia 09/11/2016   Essential hypertension 11/29/2014    No Known Allergies  Past Surgical History:  Procedure Laterality Date   COLONOSCOPY  2011   cleared for 5 yrs-    Social History   Tobacco Use   Smoking status:  Never   Smokeless tobacco: Never  Substance Use Topics   Alcohol use: No    Alcohol/week: 0.0 standard drinks of alcohol   Drug use: No     Medication list has been reviewed and updated.  Current Meds  Medication Sig   amLODipine (NORVASC) 10 MG tablet Take 1 tablet (10 mg total) by mouth daily.   aspirin EC 81 MG tablet Take 1 tablet (81 mg total) by mouth daily.   atorvastatin (LIPITOR) 10 MG tablet Take 1 tablet (10 mg total) by mouth daily.   cetirizine (ZYRTEC) 10 MG tablet Take 10 mg by mouth daily.   fluticasone (FLONASE) 50 MCG/ACT nasal spray Place 2 sprays into both nostrils daily.   hydrochlorothiazide (HYDRODIURIL) 25 MG tablet Take 1 tablet (25 mg total) by mouth daily.   metoprolol succinate (TOPROL-XL) 50 MG 24 hr tablet Take 1 tablet (50 mg total) by mouth daily. Take with or immediately following a meal.   Potassium Chloride ER 20 MEQ TBCR Take 1 tablet by mouth daily.       11/20/2021    8:00 AM 11/07/2021    3:31 PM 05/21/2021    8:15 AM 11/19/2020    8:25 AM  GAD 7 : Generalized Anxiety Score  Nervous, Anxious, on Edge 0 0 0 0  Control/stop worrying 0 0 0 0  Worry too much - different things 0 0 0 0  Trouble relaxing 0 0 0 0  Restless 0 0 0 0  Easily annoyed or irritable 0 0 0 0  Afraid - awful might happen 0 0 0 0  Total GAD 7 Score 0 0 0 0  Anxiety Difficulty Not difficult at all Not difficult at all Not difficult at all        11/20/2021    8:00 AM 11/07/2021    3:31 PM 11/06/2021   11:49 AM  Depression screen PHQ 2/9  Decreased Interest 0 0 0  Down, Depressed, Hopeless 0 0 0  PHQ - 2 Score 0 0 0  Altered sleeping 0 0 0  Tired, decreased energy 0 0 0  Change in appetite 0 0 0  Feeling bad or failure about yourself  0 0 0  Trouble concentrating 0 0 0  Moving slowly or fidgety/restless 0 0 0  Suicidal thoughts 0 0 0  PHQ-9 Score 0 0 0  Difficult doing work/chores Not difficult at all Not difficult at all Not difficult at all    BP  Readings from Last 3 Encounters:  11/20/21 136/84  11/07/21 138/60  05/21/21 130/78    Physical Exam Vitals and nursing note reviewed.  HENT:     Head: Normocephalic.     Right Ear: Tympanic membrane and external ear normal.     Left Ear: Tympanic membrane and external ear normal.     Nose: Nose normal. No congestion or rhinorrhea.     Mouth/Throat:     Mouth: Mucous membranes are moist.  Eyes:     General: No scleral icterus.       Right eye: No discharge.        Left eye: No discharge.     Conjunctiva/sclera: Conjunctivae normal.     Pupils: Pupils are equal,  round, and reactive to light.  Neck:     Thyroid: No thyromegaly.     Vascular: No JVD.     Trachea: No tracheal deviation.  Cardiovascular:     Rate and Rhythm: Normal rate and regular rhythm.     Heart sounds: Normal heart sounds. No murmur heard.    No friction rub. No gallop.  Pulmonary:     Effort: No respiratory distress.     Breath sounds: Normal breath sounds. No wheezing, rhonchi or rales.  Abdominal:     General: Bowel sounds are normal.     Palpations: Abdomen is soft. There is no mass.     Tenderness: There is no abdominal tenderness. There is no guarding or rebound.  Musculoskeletal:        General: No tenderness. Normal range of motion.     Cervical back: Normal range of motion and neck supple.  Lymphadenopathy:     Cervical: No cervical adenopathy.  Skin:    General: Skin is warm.     Findings: No rash.  Neurological:     Mental Status: He is alert and oriented to person, place, and time.     Cranial Nerves: No cranial nerve deficit.     Deep Tendon Reflexes: Reflexes are normal and symmetric.     Wt Readings from Last 3 Encounters:  11/20/21 153 lb (69.4 kg)  11/07/21 153 lb (69.4 kg)  11/06/21 152 lb (68.9 kg)    BP 136/84   Pulse 96   Ht '5\' 8"'  (1.727 m)   Wt 153 lb (69.4 kg)   SpO2 98%   BMI 23.26 kg/m   Assessment and Plan:  1. Essential hypertension Chronic.  Controlled.   Stable.  Blood pressure today is 136/84 but patient took medication about an hour and a half ago.  Asymptomatic.  Tolerating medications well.  We will reemphasize Dash diet and that I do not want to go up on the metoprolol yet.  Review of previous CMP is acceptable.  We will recheck patient in 6 months. - amLODipine (NORVASC) 10 MG tablet; Take 1 tablet (10 mg total) by mouth daily.  Dispense: 90 tablet; Refill: 1 - hydrochlorothiazide (HYDRODIURIL) 25 MG tablet; Take 1 tablet (25 mg total) by mouth daily.  Dispense: 90 tablet; Refill: 1 - metoprolol succinate (TOPROL-XL) 50 MG 24 hr tablet; Take 1 tablet (50 mg total) by mouth daily. Take with or immediately following a meal.  Dispense: 90 tablet; Refill: 1  2. Pure hypercholesterolemia Chronic.  Controlled.  Stable.  Continue atorvastatin 10 mg once a day.  Review of previous lipid panel is acceptable.  We will recheck in 6 months. - atorvastatin (LIPITOR) 10 MG tablet; Take 1 tablet (10 mg total) by mouth daily.  Dispense: 90 tablet; Refill: 1  3. Hypokalemia Chronic.  Controlled.  Stable.  Supplements with potassium sustained-release 20 mEq daily. - Potassium Chloride ER 20 MEQ TBCR; Take 1 tablet by mouth daily.  Dispense: 90 tablet; Refill: 1  4. Seasonal allergic rhinitis due to pollen Chronic.  Episodic periods stable.  Asymptomatic and patient is tolerating medication very well.  Patient doing well with fluticasone and feels like it is helping and we will continue with this. - fluticasone (FLONASE) 50 MCG/ACT nasal spray; Place 2 sprays into both nostrils daily.  Dispense: 16 g; Refill: 6    Otilio Miu, MD

## 2021-11-20 NOTE — Patient Instructions (Signed)

## 2022-05-21 ENCOUNTER — Encounter: Payer: Self-pay | Admitting: Family Medicine

## 2022-05-21 ENCOUNTER — Ambulatory Visit: Payer: Medicare PPO | Admitting: Family Medicine

## 2022-05-21 VITALS — BP 130/76 | HR 72 | Ht 68.0 in | Wt 154.0 lb

## 2022-05-21 DIAGNOSIS — E876 Hypokalemia: Secondary | ICD-10-CM | POA: Diagnosis not present

## 2022-05-21 DIAGNOSIS — J301 Allergic rhinitis due to pollen: Secondary | ICD-10-CM | POA: Diagnosis not present

## 2022-05-21 DIAGNOSIS — E7800 Pure hypercholesterolemia, unspecified: Secondary | ICD-10-CM | POA: Diagnosis not present

## 2022-05-21 DIAGNOSIS — I10 Essential (primary) hypertension: Secondary | ICD-10-CM

## 2022-05-21 MED ORDER — METOPROLOL SUCCINATE ER 50 MG PO TB24: 50.0000 mg | ORAL_TABLET | Freq: Every day | ORAL | 1 refills | Status: DC

## 2022-05-21 MED ORDER — POTASSIUM CHLORIDE ER 20 MEQ PO TBCR: 1.0000 | EXTENDED_RELEASE_TABLET | Freq: Every day | ORAL | 1 refills | Status: DC

## 2022-05-21 MED ORDER — HYDROCHLOROTHIAZIDE 25 MG PO TABS: 25.0000 mg | ORAL_TABLET | Freq: Every day | ORAL | 1 refills | Status: DC

## 2022-05-21 MED ORDER — ATORVASTATIN CALCIUM 10 MG PO TABS: 10.0000 mg | ORAL_TABLET | Freq: Every day | ORAL | 1 refills | Status: DC

## 2022-05-21 MED ORDER — AMLODIPINE BESYLATE 10 MG PO TABS: 10.0000 mg | ORAL_TABLET | Freq: Every day | ORAL | 1 refills | Status: DC

## 2022-05-21 MED ORDER — FLUTICASONE PROPIONATE 50 MCG/ACT NA SUSP: 2.0000 | Freq: Every day | NASAL | 1 refills | Status: DC

## 2022-05-21 NOTE — Progress Notes (Signed)
Date:  05/21/2022   Name:  Christopher Browning   DOB:  30-Nov-1950   MRN:  [id number]   Chief Complaint: Hypertension, Hyperlipidemia, Allergic Rhinitis , and hypokalemia  Hypertension This is a chronic problem. The current episode started more than 1 year ago. The problem has been gradually improving since onset. The problem is controlled. Pertinent negatives include no blurred vision, chest pain, headaches, neck pain, palpitations, PND or shortness of breath. There are no associated agents to hypertension. There are no known risk factors for coronary artery disease. Past treatments include beta blockers, calcium channel blockers and diuretics. The current treatment provides moderate improvement. There are no compliance problems.  Hypertensive end-organ damage includes CVA. There is no history of CAD/MI. tia. There is no history of chronic renal disease, a hypertension causing med or renovascular disease.  Hyperlipidemia This is a chronic problem. The current episode started more than 1 year ago. The problem is controlled. Recent lipid tests were reviewed and are normal. He has no history of chronic renal disease. Pertinent negatives include no chest pain, myalgias or shortness of breath. Current antihyperlipidemic treatment includes statins. The current treatment provides moderate improvement of lipids. There are no compliance problems.     Lab Results  Component Value Date   NA 141 05/21/2021   K 4.0 05/21/2021   CO2 28 05/21/2021   GLUCOSE 106 (H) 05/21/2021   BUN 12 05/21/2021   CREATININE 1.16 05/21/2021   CALCIUM 10.3 (H) 05/21/2021   EGFR 68 05/21/2021   GFRNONAA 66 04/25/2019   Lab Results  Component Value Date   CHOL 119 05/21/2021   HDL 49 05/21/2021   LDLCALC 57 05/21/2021   TRIG 60 05/21/2021   CHOLHDL 2.8 08/04/2017   No results found for: "TSH" Lab Results  Component Value Date   HGBA1C 5.6 07/29/2019   Lab Results  Component Value Date   WBC 11.9 (H) 04/22/2019    HGB 15.5 04/22/2019   HCT 44.5 04/22/2019   MCV 85.9 04/22/2019   PLT 390 04/22/2019   Lab Results  Component Value Date   ALT 43 05/21/2021   AST 35 05/21/2021   ALKPHOS 60 05/21/2021   BILITOT 0.6 05/21/2021   No results found for: "25OHVITD2", "25OHVITD3", "VD25OH"   Review of Systems  Constitutional:  Negative for chills and fever.  HENT:  Negative for drooling, ear discharge, ear pain and sore throat.   Eyes:  Negative for blurred vision.  Respiratory:  Negative for cough, shortness of breath and wheezing.   Cardiovascular:  Negative for chest pain, palpitations, leg swelling and PND.  Gastrointestinal:  Negative for abdominal pain, blood in stool, constipation, diarrhea and nausea.  Endocrine: Negative for polydipsia.  Genitourinary:  Negative for dysuria, frequency, hematuria and urgency.  Musculoskeletal:  Negative for back pain, myalgias and neck pain.  Skin:  Negative for rash.  Allergic/Immunologic: Negative for environmental allergies.  Neurological:  Negative for dizziness and headaches.  Hematological:  Does not bruise/bleed easily.  Psychiatric/Behavioral:  Negative for suicidal ideas. The patient is not nervous/anxious.     Patient Active Problem List   Diagnosis Date Noted   Hypokalemia 07/29/2019   Lumbar radiculopathy 04/18/2019   Aortic atherosclerosis 04/18/2019   Atherosclerosis of both carotid arteries 02/04/2017   Cerebral vascular disease 02/04/2017   History of atrial fibrillation 02/04/2017   TIA involving right internal carotid artery 09/11/2016   Pure hypercholesterolemia 09/11/2016   Essential hypertension 11/29/2014    No Known Allergies  Past Surgical History:  Procedure Laterality Date   COLONOSCOPY  2011   cleared for 5 yrs-    Social History   Tobacco Use   Smoking status: Never   Smokeless tobacco: Never  Substance Use Topics   Alcohol use: No    Alcohol/week: 0.0 standard drinks of alcohol   Drug use: No      Medication list has been reviewed and updated.  Current Meds  Medication Sig   amLODipine (NORVASC) 10 MG tablet Take 1 tablet (10 mg total) by mouth daily.   aspirin EC 81 MG tablet Take 1 tablet (81 mg total) by mouth daily.   atorvastatin (LIPITOR) 10 MG tablet Take 1 tablet (10 mg total) by mouth daily.   cetirizine (ZYRTEC) 10 MG tablet Take 10 mg by mouth daily.   fluticasone (FLONASE) 50 MCG/ACT nasal spray Place 2 sprays into both nostrils daily.   hydrochlorothiazide (HYDRODIURIL) 25 MG tablet Take 1 tablet (25 mg total) by mouth daily.   metoprolol succinate (TOPROL-XL) 50 MG 24 hr tablet Take 1 tablet (50 mg total) by mouth daily. Take with or immediately following a meal.   Potassium Chloride ER 20 MEQ TBCR Take 1 tablet by mouth daily.       05/21/2022    8:04 AM 11/20/2021    8:00 AM 11/07/2021    3:31 PM 05/21/2021    8:15 AM  GAD 7 : Generalized Anxiety Score  Nervous, Anxious, on Edge 0 0 0 0  Control/stop worrying 0 0 0 0  Worry too much - different things 0 0 0 0  Trouble relaxing 0 0 0 0  Restless 0 0 0 0  Easily annoyed or irritable 0 0 0 0  Afraid - awful might happen 0 0 0 0  Total GAD 7 Score 0 0 0 0  Anxiety Difficulty Not difficult at all Not difficult at all Not difficult at all Not difficult at all       05/21/2022    8:03 AM 11/20/2021    8:00 AM 11/07/2021    3:31 PM  Depression screen PHQ 2/9  Decreased Interest 0 0 0  Down, Depressed, Hopeless 0 0 0  PHQ - 2 Score 0 0 0  Altered sleeping 0 0 0  Tired, decreased energy 0 0 0  Change in appetite 0 0 0  Feeling bad or failure about yourself  0 0 0  Trouble concentrating 0 0 0  Moving slowly or fidgety/restless 0 0 0  Suicidal thoughts 0 0 0  PHQ-9 Score 0 0 0  Difficult doing work/chores Not difficult at all Not difficult at all Not difficult at all    BP Readings from Last 3 Encounters:  05/21/22 130/76  11/20/21 136/84  11/07/21 138/60    Physical Exam Vitals and nursing  note reviewed.  HENT:     Head: Normocephalic.     Right Ear: Tympanic membrane and external ear normal.     Left Ear: Tympanic membrane and external ear normal.     Nose: Congestion present.     Mouth/Throat:     Mouth: Mucous membranes are moist.  Eyes:     General: No scleral icterus.       Right eye: No discharge.        Left eye: No discharge.     Conjunctiva/sclera: Conjunctivae normal.     Pupils: Pupils are equal, round, and reactive to light.  Neck:     Thyroid: No thyromegaly.  Vascular: No JVD.     Trachea: No tracheal deviation.  Cardiovascular:     Rate and Rhythm: Normal rate and regular rhythm.     Heart sounds: Normal heart sounds. No murmur heard.    No friction rub. No gallop.  Pulmonary:     Effort: No respiratory distress.     Breath sounds: Normal breath sounds. No wheezing, rhonchi or rales.  Abdominal:     General: Bowel sounds are normal.     Palpations: Abdomen is soft. There is no mass.     Tenderness: There is no abdominal tenderness. There is no guarding or rebound.  Musculoskeletal:        General: No tenderness. Normal range of motion.     Cervical back: Normal range of motion and neck supple.  Lymphadenopathy:     Cervical: No cervical adenopathy.  Skin:    General: Skin is warm.     Findings: No rash.  Neurological:     Mental Status: He is alert and oriented to person, place, and time.     Cranial Nerves: No cranial nerve deficit.     Deep Tendon Reflexes: Reflexes are normal and symmetric.     Wt Readings from Last 3 Encounters:  05/21/22 154 lb (69.9 kg)  11/20/21 153 lb (69.4 kg)  11/07/21 153 lb (69.4 kg)    BP 130/76   Pulse 72   Ht  (1.727 m)   Wt 154 lb (69.9 kg)   SpO2 98%   BMI 23.42 kg/m   Assessment and Plan: 1. Essential hypertension Chronic.  Controlled.  Stable.  Blood pressure today is 130/76.  Asymptomatic.  Tolerating medications well.  Continue amlodipine 10 mg once a day, hydrochlorothiazide 25 mg  once a day, and metoprolol XL 50 mg once a day.  Will check CMP for electrolytes and GFR.  Will recheck patient in 6 months. - amLODipine (NORVASC) 10 MG tablet; Take 1 tablet (10 mg total) by mouth daily.  Dispense: 90 tablet; Refill: 1 - hydrochlorothiazide (HYDRODIURIL) 25 MG tablet; Take 1 tablet (25 mg total) by mouth daily.  Dispense: 90 tablet; Refill: 1 - metoprolol succinate (TOPROL-XL) 50 MG 24 hr tablet; Take 1 tablet (50 mg total) by mouth daily. Take with or immediately following a meal.  Dispense: 90 tablet; Refill: 1 - Comprehensive Metabolic Panel (CMET)  2. Pure hypercholesterolemia Chronic.  Controlled.  Stable.  Continue atorvastatin 10 mg once a day.  Will check lipid panel and encourage low-cholesterol low triglyceride dietary guidelines. - atorvastatin (LIPITOR) 10 MG tablet; Take 1 tablet (10 mg total) by mouth daily.  Dispense: 90 tablet; Refill: 1 - Lipid Panel With LDL/HDL Ratio  3. Seasonal allergic rhinitis due to pollen Chronic.  Controlled.  Stable.  Continue at this time with Flonase nasal spray 2 sprays nostril daily. - fluticasone (FLONASE) 50 MCG/ACT nasal spray; Place 2 sprays into both nostrils daily.  Dispense: 16 g; Refill: 1  4. Hypokalemia Previous history of decreased potassium which is supplemented with potassium chloride extended release 20 mEq and we will continue with current dosing and will check with electrolytes with CMP. - Potassium Chloride ER 20 MEQ TBCR; Take 1 tablet (20 mEq total) by mouth daily.  Dispense: 90 tablet; Refill: 1 - Comprehensive Metabolic Panel (CMET)   Review of MRI noted that patient had cerebrovascular disease of the small vessels and encouraged to continue on current dosing of low-dose aspirin.  Elizabeth Sauer, MD

## 2022-05-21 NOTE — Patient Instructions (Signed)

## 2022-05-22 LAB — COMPREHENSIVE METABOLIC PANEL
ALT: 49 IU/L — ABNORMAL HIGH (ref 0–44)
AST: 39 IU/L (ref 0–40)
Albumin/Globulin Ratio: 1.6 (ref 1.2–2.2)
Albumin: 4.6 g/dL (ref 3.9–4.9)
Alkaline Phosphatase: 57 IU/L (ref 44–121)
BUN/Creatinine Ratio: 13 (ref 10–24)
BUN: 13 mg/dL (ref 8–27)
Bilirubin Total: 0.7 mg/dL (ref 0.0–1.2)
CO2: 22 mmol/L (ref 20–29)
Calcium: 9.9 mg/dL (ref 8.6–10.2)
Chloride: 97 mmol/L (ref 96–106)
Creatinine, Ser: 0.99 mg/dL (ref 0.76–1.27)
Globulin, Total: 2.9 g/dL (ref 1.5–4.5)
Glucose: 99 mg/dL (ref 70–99)
Potassium: 3.7 mmol/L (ref 3.5–5.2)
Sodium: 138 mmol/L (ref 134–144)
Total Protein: 7.5 g/dL (ref 6.0–8.5)
eGFR: 82 mL/min/{1.73_m2} (ref >59)

## 2022-05-22 LAB — LIPID PANEL WITH LDL/HDL RATIO
Cholesterol, Total: 121 mg/dL (ref 100–199)
HDL: 51 mg/dL (ref >39)
LDL Chol Calc (NIH): 58 mg/dL (ref 0–99)
LDL/HDL Ratio: 1.1 ratio (ref 0.0–3.6)
Triglycerides: 50 mg/dL (ref 0–149)
VLDL Cholesterol Cal: 12 mg/dL (ref 5–40)

## 2022-10-14 DIAGNOSIS — H5203 Hypermetropia, bilateral: Secondary | ICD-10-CM | POA: Diagnosis not present

## 2022-10-14 DIAGNOSIS — H2513 Age-related nuclear cataract, bilateral: Secondary | ICD-10-CM | POA: Diagnosis not present

## 2022-10-14 DIAGNOSIS — H524 Presbyopia: Secondary | ICD-10-CM | POA: Diagnosis not present

## 2022-11-12 ENCOUNTER — Ambulatory Visit: Payer: Medicare PPO

## 2022-11-12 VITALS — BP 142/60 | Ht 68.0 in | Wt 154.4 lb

## 2022-11-12 DIAGNOSIS — Z0000 Encounter for general adult medical examination without abnormal findings: Secondary | ICD-10-CM | POA: Diagnosis not present

## 2022-11-12 NOTE — Progress Notes (Signed)
Subjective:   Christopher Browning is a 72 y.o. male who presents for Medicare Annual/Subsequent preventive examination.  Visit Complete: In person  Cardiac Risk Factors include: advanced age (>36men, >46 women);hypertension;male gender     Objective:    Today's Vitals   11/12/22 1057  BP: (!) 142/60  Weight: 154 lb 6.4 oz (70 kg)  Height: 5\' 8"  (1.727 m)   Body mass index is 23.48 kg/m.     11/12/2022   11:06 AM 11/06/2021   11:48 AM 11/05/2020   11:31 AM 09/07/2019    8:54 AM 04/22/2019    1:11 PM 11/29/2014    8:24 AM  Advanced Directives  Does Patient Have a Medical Advance Directive? No Yes No No No Yes  Type of Advance Directive  Living will    Living will  Does patient want to make changes to medical advance directive?    Yes (MAU/Ambulatory/Procedural Areas - Information given)    Copy of Healthcare Power of Attorney in Chart?      No - copy requested  Would patient like information on creating a medical advance directive? No - Patient declined  Yes (MAU/Ambulatory/Procedural Areas - Information given)  No - Patient declined     Current Medications (verified) Outpatient Encounter Medications as of 11/12/2022  Medication Sig   amLODipine (NORVASC) 10 MG tablet Take 1 tablet (10 mg total) by mouth daily.   aspirin EC 81 MG tablet Take 1 tablet (81 mg total) by mouth daily.   atorvastatin (LIPITOR) 10 MG tablet Take 1 tablet (10 mg total) by mouth daily.   fluticasone (FLONASE) 50 MCG/ACT nasal spray Place 2 sprays into both nostrils daily.   hydrochlorothiazide (HYDRODIURIL) 25 MG tablet Take 1 tablet (25 mg total) by mouth daily.   metoprolol succinate (TOPROL-XL) 50 MG 24 hr tablet Take 1 tablet (50 mg total) by mouth daily. Take with or immediately following a meal.   Potassium Chloride ER 20 MEQ TBCR Take 1 tablet (20 mEq total) by mouth daily.   cetirizine (ZYRTEC) 10 MG tablet Take 10 mg by mouth daily. (Patient not taking: Reported on 11/12/2022)   No  facility-administered encounter medications on file as of 11/12/2022.    Allergies (verified) Patient has no known allergies.   History: Past Medical History:  Diagnosis Date   Hyperlipidemia    Hypertension    Past Surgical History:  Procedure Laterality Date   COLONOSCOPY  2011   cleared for 5 yrs-   History reviewed. No pertinent family history. Social History   Socioeconomic History   Marital status: Married    Spouse name: Not on file   Number of children: Not on file   Years of education: Not on file   Highest education level: Not on file  Occupational History   Not on file  Tobacco Use   Smoking status: Never   Smokeless tobacco: Never  Substance and Sexual Activity   Alcohol use: No    Alcohol/week: 0.0 standard drinks of alcohol   Drug use: No   Sexual activity: Not on file  Other Topics Concern   Not on file  Social History Narrative   Not on file   Social Determinants of Health   Financial Resource Strain: Low Risk  (11/12/2022)   Overall Financial Resource Strain (CARDIA)    Difficulty of Paying Living Expenses: Not hard at all  Food Insecurity: No Food Insecurity (11/12/2022)   Hunger Vital Sign    Worried About Running Out of  Food in the Last Year: Never true    Ran Out of Food in the Last Year: Never true  Transportation Needs: No Transportation Needs (11/12/2022)   PRAPARE - Administrator, Civil Service (Medical): No    Lack of Transportation (Non-Medical): No  Physical Activity: Sufficiently Active (11/12/2022)   Exercise Vital Sign    Days of Exercise per Week: 4 days    Minutes of Exercise per Session: 60 min  Stress: No Stress Concern Present (11/12/2022)   [name]    Feeling of Stress : Not at all  Social Connections: Moderately Integrated (11/12/2022)   Social Connection and Isolation Panel [NHANES]    Frequency of Communication with Friends and Family:  Twice a week    Frequency of Social Gatherings with Friends and Family: Once a week    Attends Religious Services: More than 4 times per year    Active Member of [employer] or Organizations: No    Attends Engineer, structural: Never    Marital Status: Married    Tobacco Counseling Counseling given: Not Answered   Clinical Intake:  Pre-visit preparation completed: Yes  Pain : No/denies pain     Nutritional Status: BMI of 19-24  Normal Nutritional Risks: None Diabetes: No  How often do you need to have someone help you when you read instructions, pamphlets, or other written materials from your doctor or pharmacy?: 1 - Never  Interpreter Needed?: No  Information entered by :: Kennedy Bucker, LPN   Activities of Daily Living    11/12/2022   11:07 AM  In your present state of health, do you have any difficulty performing the following activities:  Hearing? 0  Vision? 0  Difficulty concentrating or making decisions? 0  Walking or climbing stairs? 0  Dressing or bathing? 0  Doing errands, shopping? 0  Preparing Food and eating ? N  Using the Toilet? N  In the past six months, have you accidently leaked urine? N  Do you have problems with loss of bowel control? N  Managing your Medications? N  Managing your Finances? N  Housekeeping or managing your Housekeeping? N    Patient Care Team: Duanne Limerick, MD as PCP - General (Family Medicine)  Indicate any recent Medical Services you may have received from other than [facility] providers in the past year (date may be approximate).     Assessment:   This is a routine wellness examination for Christopher Browning.  Hearing/Vision screen Hearing Screening - Comments:: No aids Vision Screening - Comments:: Wears glasses- Dr.King   Goals Addressed             This Visit's Progress    DIET - EAT MORE FRUITS AND VEGETABLES         Depression Screen    11/12/2022   11:03 AM 05/21/2022    8:03 AM 11/20/2021    8:00 AM  11/07/2021    3:31 PM 11/06/2021   11:49 AM 05/21/2021    8:15 AM 11/19/2020    8:25 AM  PHQ 2/9 Scores  PHQ - 2 Score 0 0 0 0 0 0 0  PHQ- 9 Score 0 0 0 0 0 0 0    Fall Risk    11/12/2022   11:07 AM 05/21/2022    8:03 AM 11/20/2021    8:00 AM 11/07/2021    3:31 PM 11/06/2021   11:49 AM  Fall Risk   Falls in the  past year? 0 0 0 0 0  Number falls in past yr: 0 0 0 0 0  Injury with Fall? 0 0 0 0 0  Risk for fall due to : No Fall Risks No Fall Risks No Fall Risks No Fall Risks No Fall Risks  Follow up Falls prevention discussed;Falls evaluation completed Falls evaluation completed Falls evaluation completed Falls evaluation completed Falls evaluation completed    MEDICARE RISK AT HOME: Medicare Risk at Home Any stairs in or around the home?: Yes If so, are there any without handrails?: No Home free of loose throw rugs in walkways, pet beds, electrical cords, etc?: Yes Adequate lighting in your home to reduce risk of falls?: Yes Life alert?: No Use of a cane, walker or w/c?: No Grab bars in the bathroom?: No Shower chair or bench in shower?: Yes Elevated toilet seat or a handicapped toilet?: No  TIMED UP AND GO:  Was the test performed?  Yes  Length of time to ambulate 10 feet: 4 sec Gait steady and fast without use of assistive device    Cognitive Function:        11/12/2022   11:08 AM 11/06/2021   11:50 AM  6CIT Screen  What Year? 0 points 0 points  What month? 0 points 0 points  What time? 0 points 0 points  Count back from 20 2 points 0 points  Months in reverse 2 points 2 points  Repeat phrase 0 points 2 points  Total Score 4 points 4 points    Immunizations Immunization History  Administered Date(s) Administered   Fluad Quad(high Dose 65+) 11/07/2021   Influenza, High Dose Seasonal PF 11/04/2017, 10/12/2020   Influenza,inj,Quad PF,6+ Mos 10/03/2016, 10/28/2018   Influenza-Unspecified 09/28/2019, 10/12/2022   Moderna Sars-Covid-2 Vaccination  03/11/2019, 04/08/2019, 12/06/2019   Pneumococcal Conjugate-13 08/04/2017   Pneumococcal Polysaccharide-23 01/31/2019   Tdap 11/29/2015    TDAP status: Up to date  Flu Vaccine status: Up to date  Pneumococcal vaccine status: Up to date  Covid-19 vaccine status: Completed vaccines  Qualifies for Shingles Vaccine? Yes   Zostavax completed No   Shingrix Completed?: No.    Education has been provided regarding the importance of this vaccine. Patient has been advised to call insurance company to determine out of pocket expense if they have not yet received this vaccine. Advised may also receive vaccine at local pharmacy or Health Dept. Verbalized acceptance and understanding.  Screening Tests Health Maintenance  Topic Date Due   Zoster Vaccines- Shingrix (1 of 2) Never done   COVID-19 Vaccine (4 - 2023-24 season) 09/28/2022   Medicare Annual Wellness (AWV)  11/12/2023   DTaP/Tdap/Td (2 - Td or Tdap) 11/28/2025   Colonoscopy  11/28/2025   Pneumonia Vaccine 13+ Years old  Completed   INFLUENZA VACCINE  Completed   Hepatitis C Screening  Completed   HPV VACCINES  Aged Out    Health Maintenance  Health Maintenance Due  Topic Date Due   Zoster Vaccines- Shingrix (1 of 2) Never done   COVID-19 Vaccine (4 - 2023-24 season) 09/28/2022    Colorectal cancer screening: Type of screening: Colonoscopy. Completed 11/29/15. Repeat every 10 years  Lung Cancer Screening: (Low Dose CT Chest recommended if Age 55-80 years, 20 pack-year currently smoking OR have quit w/in 15years.) does not qualify.    Additional Screening:  Hepatitis C Screening: does qualify; Completed 02/04/17  Vision Screening: Recommended annual ophthalmology exams for early detection of glaucoma and other disorders of the eye. Is  the patient up to date with their annual eye exam?  Yes  Who is the provider or what is the name of the office in which the patient attends annual eye exams? Dr.King If pt is not established  with a provider, would they like to be referred to a provider to establish care? No .   Dental Screening: Recommended annual dental exams for proper oral hygiene   Community Resource Referral / Chronic Care Management: CRR required this visit?  No   CCM required this visit?  No     Plan:     I have personally reviewed and noted the following in the patient's chart:   Medical and social history Use of alcohol, tobacco or illicit drugs  Current medications and supplements including opioid prescriptions. Patient is not currently taking opioid prescriptions. Functional ability and status Nutritional status Physical activity Advanced directives List of other physicians Hospitalizations, surgeries, and ER visits in previous 12 months Vitals Screenings to include cognitive, depression, and falls Referrals and appointments  In addition, I have reviewed and discussed with patient certain preventive protocols, quality metrics, and best practice recommendations. A written personalized care plan for preventive services as well as general preventive health recommendations were provided to patient.     Hal Hope, LPN   [id number]/04/[id number]   After Visit Summary: (In Person-Declined) Patient declined AVS at this time.  Nurse Notes: none

## 2022-11-12 NOTE — Patient Instructions (Addendum)
[name] , Thank you for taking time to come for your Medicare Wellness Visit. I appreciate your ongoing commitment to your health goals. Please review the following plan we discussed and let me know if I can assist you in the future.   Referrals/Orders/Follow-Ups/Clinician Recommendations: none  This is a list of the screening recommended for you and due dates:  Health Maintenance  Topic Date Due   Zoster (Shingles) Vaccine (1 of 2) Never done   COVID-19 Vaccine (4 - 2023-24 season) 09/28/2022   Medicare Annual Wellness Visit  11/12/2023   DTaP/Tdap/Td vaccine (2 - Td or Tdap) 11/28/2025   Colon Cancer Screening  11/28/2025   Pneumonia Vaccine  Completed   Flu Shot  Completed   Hepatitis C Screening  Completed   HPV Vaccine  Aged Out    Advanced directives: (ACP Link)Information on Advanced Care Planning can be found at [facility] Advance Health Care Directives Advance Health Care Directives ([email or web address]   Next Medicare Annual Wellness Visit scheduled for next year: Yes   11/18/23 @ 9:35 am in person

## 2022-11-20 ENCOUNTER — Encounter: Payer: Self-pay | Admitting: Family Medicine

## 2022-11-20 ENCOUNTER — Ambulatory Visit: Payer: Medicare PPO | Admitting: Family Medicine

## 2022-11-20 VITALS — BP 132/70 | HR 64 | Ht 68.0 in | Wt 152.0 lb

## 2022-11-20 DIAGNOSIS — E876 Hypokalemia: Secondary | ICD-10-CM

## 2022-11-20 DIAGNOSIS — E7800 Pure hypercholesterolemia, unspecified: Secondary | ICD-10-CM

## 2022-11-20 DIAGNOSIS — I10 Essential (primary) hypertension: Secondary | ICD-10-CM | POA: Diagnosis not present

## 2022-11-20 DIAGNOSIS — J301 Allergic rhinitis due to pollen: Secondary | ICD-10-CM | POA: Diagnosis not present

## 2022-11-20 MED ORDER — METOPROLOL SUCCINATE ER 50 MG PO TB24
50.0000 mg | ORAL_TABLET | Freq: Every day | ORAL | 1 refills | Status: AC
Start: 2022-11-20 — End: ?

## 2022-11-20 MED ORDER — HYDROCHLOROTHIAZIDE 25 MG PO TABS
25.0000 mg | ORAL_TABLET | Freq: Every day | ORAL | 1 refills | Status: AC
Start: 2022-11-20 — End: ?

## 2022-11-20 MED ORDER — ATORVASTATIN CALCIUM 10 MG PO TABS
10.0000 mg | ORAL_TABLET | Freq: Every day | ORAL | 1 refills | Status: AC
Start: 2022-11-20 — End: ?

## 2022-11-20 MED ORDER — POTASSIUM CHLORIDE ER 20 MEQ PO TBCR
1.0000 | EXTENDED_RELEASE_TABLET | Freq: Every day | ORAL | 1 refills | Status: AC
Start: 2022-11-20 — End: ?

## 2022-11-20 MED ORDER — AMLODIPINE BESYLATE 10 MG PO TABS
10.0000 mg | ORAL_TABLET | Freq: Every day | ORAL | 1 refills | Status: AC
Start: 2022-11-20 — End: ?

## 2022-11-20 MED ORDER — FLUTICASONE PROPIONATE 50 MCG/ACT NA SUSP
2.0000 | Freq: Every day | NASAL | 1 refills | Status: AC
Start: 2022-11-20 — End: ?

## 2022-11-20 NOTE — Progress Notes (Signed)
Date:  11/20/2022   Name:  Christopher Browning   DOB:  1950-06-07   MRN:  [id number]   Chief Complaint: Hypertension, Allergies, and Hyperlipidemia  Hypertension This is a chronic problem. The current episode started more than 1 year ago. The problem has been gradually improving since onset. The problem is controlled. Pertinent negatives include no anxiety, blurred vision, chest pain, headaches, malaise/fatigue, neck pain, orthopnea, palpitations, peripheral edema, PND, shortness of breath or sweats. There are no associated agents to hypertension. There are no known risk factors for coronary artery disease. Past treatments include calcium channel blockers and beta blockers. The current treatment provides moderate improvement. There are no compliance problems.  There is no history of angina, kidney disease, CAD/MI, CVA, heart failure, left ventricular hypertrophy, PVD or retinopathy. There is no history of chronic renal disease, a hypertension causing med or renovascular disease.  Hyperlipidemia This is a chronic problem. The current episode started more than 1 year ago. The problem is controlled. Recent lipid tests were reviewed and are normal. He has no history of chronic renal disease, diabetes, hypothyroidism, liver disease, obesity or nephrotic syndrome. Pertinent negatives include no chest pain, myalgias or shortness of breath. Current antihyperlipidemic treatment includes statins. The current treatment provides moderate improvement of lipids. There are no compliance problems.  Risk factors for coronary artery disease include dyslipidemia.    Lab Results  Component Value Date   NA 138 05/21/2022   K 3.7 05/21/2022   CO2 22 05/21/2022   GLUCOSE 99 05/21/2022   BUN 13 05/21/2022   CREATININE 0.99 05/21/2022   CALCIUM 9.9 05/21/2022   EGFR 82 05/21/2022   GFRNONAA 66 04/25/2019   Lab Results  Component Value Date   CHOL 121 05/21/2022   HDL 51 05/21/2022   LDLCALC 58 05/21/2022   TRIG  50 05/21/2022   CHOLHDL 2.8 08/04/2017   No results found for: "TSH" Lab Results  Component Value Date   HGBA1C 5.6 07/29/2019   Lab Results  Component Value Date   WBC 11.9 (H) 04/22/2019   HGB 15.5 04/22/2019   HCT 44.5 04/22/2019   MCV 85.9 04/22/2019   PLT 390 04/22/2019   Lab Results  Component Value Date   ALT 49 (H) 05/21/2022   AST 39 05/21/2022   ALKPHOS 57 05/21/2022   BILITOT 0.7 05/21/2022   No results found for: "25OHVITD2", "25OHVITD3", "VD25OH"   Review of Systems  Constitutional:  Negative for chills, fever and malaise/fatigue.  HENT:  Negative for drooling, ear discharge, ear pain and sore throat.   Eyes:  Negative for blurred vision.  Respiratory:  Negative for cough, shortness of breath and wheezing.   Cardiovascular:  Negative for chest pain, palpitations, orthopnea, leg swelling and PND.  Gastrointestinal:  Negative for abdominal pain, blood in stool, constipation, diarrhea and nausea.  Endocrine: Negative for polydipsia.  Genitourinary:  Negative for dysuria, frequency, hematuria and urgency.  Musculoskeletal:  Negative for back pain, myalgias and neck pain.  Skin:  Negative for rash.  Allergic/Immunologic: Negative for environmental allergies.  Neurological:  Negative for dizziness and headaches.  Hematological:  Does not bruise/bleed easily.  Psychiatric/Behavioral:  Negative for suicidal ideas. The patient is not nervous/anxious.     Patient Active Problem List   Diagnosis Date Noted   Hypokalemia 07/29/2019   Lumbar radiculopathy 04/18/2019   Aortic atherosclerosis (HCC) 04/18/2019   Atherosclerosis of both carotid arteries 02/04/2017   Cerebral vascular disease 02/04/2017   History of atrial fibrillation 02/04/2017  TIA involving right internal carotid artery 09/11/2016   Pure hypercholesterolemia 09/11/2016   Essential hypertension 11/29/2014    No Known Allergies  Past Surgical History:  Procedure Laterality Date   COLONOSCOPY   2011   cleared for 5 yrs-    Social History   Tobacco Use   Smoking status: Never   Smokeless tobacco: Never  Substance Use Topics   Alcohol use: No    Alcohol/week: 0.0 standard drinks of alcohol   Drug use: No     Medication list has been reviewed and updated.  Current Meds  Medication Sig   amLODipine (NORVASC) 10 MG tablet Take 1 tablet (10 mg total) by mouth daily.   aspirin EC 81 MG tablet Take 1 tablet (81 mg total) by mouth daily.   atorvastatin (LIPITOR) 10 MG tablet Take 1 tablet (10 mg total) by mouth daily.   fluticasone (FLONASE) 50 MCG/ACT nasal spray Place 2 sprays into both nostrils daily.   hydrochlorothiazide (HYDRODIURIL) 25 MG tablet Take 1 tablet (25 mg total) by mouth daily.   metoprolol succinate (TOPROL-XL) 50 MG 24 hr tablet Take 1 tablet (50 mg total) by mouth daily. Take with or immediately following a meal.   Potassium Chloride ER 20 MEQ TBCR Take 1 tablet (20 mEq total) by mouth daily.   [DISCONTINUED] cetirizine (ZYRTEC) 10 MG tablet Take 10 mg by mouth daily.       11/20/2022    8:23 AM 05/21/2022    8:04 AM 11/20/2021    8:00 AM 11/07/2021    3:31 PM  GAD 7 : Generalized Anxiety Score  Nervous, Anxious, on Edge 0 0 0 0  Control/stop worrying 0 0 0 0  Worry too much - different things 0 0 0 0  Trouble relaxing 0 0 0 0  Restless 0 0 0 0  Easily annoyed or irritable 0 0 0 0  Afraid - awful might happen 0 0 0 0  Total GAD 7 Score 0 0 0 0  Anxiety Difficulty Not difficult at all Not difficult at all Not difficult at all Not difficult at all       11/20/2022    8:23 AM 11/12/2022   11:03 AM 05/21/2022    8:03 AM  Depression screen PHQ 2/9  Decreased Interest 0 0 0  Down, Depressed, Hopeless 0 0 0  PHQ - 2 Score 0 0 0  Altered sleeping 0 0 0  Tired, decreased energy 0 0 0  Change in appetite 0 0 0  Feeling bad or failure about yourself  0 0 0  Trouble concentrating 0 0 0  Moving slowly or fidgety/restless 0 0 0  Suicidal thoughts 0  0 0  PHQ-9 Score 0 0 0  Difficult doing work/chores Not difficult at all Not difficult at all Not difficult at all    BP Readings from Last 3 Encounters:  11/20/22 (!) 152/76  11/12/22 (!) 142/60  05/21/22 130/76    Physical Exam Vitals and nursing note reviewed.  HENT:     Head: Normocephalic.     Right Ear: Tympanic membrane and external ear normal.     Left Ear: Tympanic membrane and external ear normal.     Nose: Nose normal.     Mouth/Throat:     Mouth: Mucous membranes are moist.  Eyes:     General: No scleral icterus.       Right eye: No discharge.        Left eye: No discharge.  Conjunctiva/sclera: Conjunctivae normal.     Pupils: Pupils are equal, round, and reactive to light.  Neck:     Thyroid: No thyromegaly.     Vascular: No JVD.     Trachea: No tracheal deviation.  Cardiovascular:     Rate and Rhythm: Normal rate and regular rhythm.     Heart sounds: Normal heart sounds. No murmur heard.    No friction rub. No gallop.  Pulmonary:     Effort: No respiratory distress.     Breath sounds: Normal breath sounds. No wheezing or rales.  Abdominal:     General: Bowel sounds are normal.     Palpations: Abdomen is soft. There is no mass.     Tenderness: There is no abdominal tenderness. There is no guarding or rebound.  Musculoskeletal:        General: No tenderness. Normal range of motion.     Cervical back: Normal range of motion and neck supple.  Lymphadenopathy:     Cervical: No cervical adenopathy.  Skin:    General: Skin is warm.     Findings: No rash.  Neurological:     Mental Status: He is alert and oriented to person, place, and time.     Cranial Nerves: No cranial nerve deficit.     Deep Tendon Reflexes: Reflexes are normal and symmetric.     Wt Readings from Last 3 Encounters:  11/20/22 152 lb (68.9 kg)  11/12/22 154 lb 6.4 oz (70 kg)  05/21/22 154 lb (69.9 kg)    BP (!) 152/76   Pulse 64   Ht 5\' 8"  (1.727 m)   Wt 152 lb (68.9 kg)    SpO2 97%   BMI 23.11 kg/m   Assessment and Plan:  1. Hypokalemia Chronic.  Controlled.  Stable.  Continue potassium at current dosing of 20 mEq.  Will recheck in 6 months. - Potassium Chloride ER 20 MEQ TBCR; Take 1 tablet (20 mEq total) by mouth daily.  Dispense: 90 tablet; Refill: 1  2. Essential hypertension Chronic.  Controlled.  Stable.  Blood pressure 132/70.  Asymptomatic.  Tolerating medications well.  Continue metoprolol XL 50 mg once a day and hydrochlorothiazide 25 mg once a day and amlodipine 10 mg once a day.  Will check CMP for electrolytes and GFR - metoprolol succinate (TOPROL-XL) 50 MG 24 hr tablet; Take 1 tablet (50 mg total) by mouth daily. Take with or immediately following a meal.  Dispense: 90 tablet; Refill: 1 - hydrochlorothiazide (HYDRODIURIL) 25 MG tablet; Take 1 tablet (25 mg total) by mouth daily.  Dispense: 90 tablet; Refill: 1 - amLODipine (NORVASC) 10 MG tablet; Take 1 tablet (10 mg total) by mouth daily.  Dispense: 90 tablet; Refill: 1 - Comprehensive metabolic panel  3. Seasonal allergic rhinitis due to pollen Chronic.  Controlled.  Stable.  Continue Flonase at current dosing. - fluticasone (FLONASE) 50 MCG/ACT nasal spray; Place 2 sprays into both nostrils daily.  Dispense: 16 g; Refill: 1  4. Pure hypercholesterolemia Chronic.  Controlled.  Stable.  Review of previous lipid panel was acceptable.  Continue atorvastatin 10 mg once a day. - atorvastatin (LIPITOR) 10 MG tablet; Take 1 tablet (10 mg total) by mouth daily.  Dispense: 90 tablet; Refill: 1    Elizabeth Sauer, MD

## 2022-11-21 LAB — COMPREHENSIVE METABOLIC PANEL
ALT: 39 [IU]/L (ref 0–44)
AST: 32 [IU]/L (ref 0–40)
Albumin: 4.8 g/dL (ref 3.9–4.9)
Alkaline Phosphatase: 56 [IU]/L (ref 44–121)
BUN/Creatinine Ratio: 13 (ref 10–24)
BUN: 14 mg/dL (ref 8–27)
Bilirubin Total: 0.8 mg/dL (ref 0.0–1.2)
CO2: 27 mmol/L (ref 20–29)
Calcium: 10.4 mg/dL — ABNORMAL HIGH (ref 8.6–10.2)
Chloride: 95 mmol/L — ABNORMAL LOW (ref 96–106)
Creatinine, Ser: 1.09 mg/dL (ref 0.76–1.27)
Globulin, Total: 2.9 g/dL (ref 1.5–4.5)
Glucose: 103 mg/dL — ABNORMAL HIGH (ref 70–99)
Potassium: 4.1 mmol/L (ref 3.5–5.2)
Sodium: 136 mmol/L (ref 134–144)
Total Protein: 7.7 g/dL (ref 6.0–8.5)
eGFR: 73 mL/min/{1.73_m2} (ref >59)

## 2023-02-03 DIAGNOSIS — R7309 Other abnormal glucose: Secondary | ICD-10-CM | POA: Diagnosis not present

## 2023-02-03 DIAGNOSIS — E7800 Pure hypercholesterolemia, unspecified: Secondary | ICD-10-CM | POA: Diagnosis not present

## 2023-02-03 DIAGNOSIS — E876 Hypokalemia: Secondary | ICD-10-CM | POA: Diagnosis not present

## 2023-02-03 DIAGNOSIS — I10 Essential (primary) hypertension: Secondary | ICD-10-CM | POA: Diagnosis not present

## 2023-02-03 DIAGNOSIS — Z0000 Encounter for general adult medical examination without abnormal findings: Secondary | ICD-10-CM | POA: Diagnosis not present

## 2023-02-03 DIAGNOSIS — Z1321 Encounter for screening for nutritional disorder: Secondary | ICD-10-CM | POA: Diagnosis not present

## 2023-02-03 DIAGNOSIS — E538 Deficiency of other specified B group vitamins: Secondary | ICD-10-CM | POA: Diagnosis not present

## 2023-05-04 ENCOUNTER — Ambulatory Visit: Payer: Self-pay | Admitting: Family Medicine
